# Patient Record
Sex: Male | Born: 1990 | Race: Black or African American | Hispanic: No | Marital: Single | Smoking: Current every day smoker
Health system: Southern US, Community
[De-identification: ages and names within clinical notes are randomized; demographics above are authoritative.]

---
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
## 2008-02-01 ENCOUNTER — Emergency Department (HOSPITAL_COMMUNITY): Admission: EM | Admit: 2008-02-01 | Discharge: 2008-02-01 | Payer: Self-pay | Admitting: Family Medicine

## 2009-01-12 ENCOUNTER — Emergency Department (HOSPITAL_COMMUNITY): Admission: EM | Admit: 2009-01-12 | Discharge: 2009-01-12 | Payer: Self-pay | Admitting: Emergency Medicine

## 2009-02-24 ENCOUNTER — Emergency Department (HOSPITAL_COMMUNITY): Admission: EM | Admit: 2009-02-24 | Discharge: 2009-02-24 | Payer: Self-pay | Admitting: Emergency Medicine

## 2009-04-29 ENCOUNTER — Emergency Department (HOSPITAL_COMMUNITY): Admission: EM | Admit: 2009-04-29 | Discharge: 2009-04-30 | Payer: Self-pay | Admitting: Emergency Medicine

## 2009-05-01 ENCOUNTER — Emergency Department (HOSPITAL_COMMUNITY): Admission: EM | Admit: 2009-05-01 | Discharge: 2009-05-01 | Payer: Self-pay | Admitting: Emergency Medicine

## 2010-04-27 ENCOUNTER — Emergency Department (HOSPITAL_COMMUNITY): Admission: EM | Admit: 2010-04-27 | Discharge: 2010-04-27 | Payer: Self-pay | Admitting: Emergency Medicine

## 2010-05-01 ENCOUNTER — Emergency Department (HOSPITAL_COMMUNITY): Admission: EM | Admit: 2010-05-01 | Discharge: 2010-05-01 | Payer: Self-pay | Admitting: Emergency Medicine

## 2010-07-24 IMAGING — CR DG KNEE 1-2V*R*
2 series · 2 of 2 positions shown · non-contrast
Comparison: None

CLINICAL DATA: Pain and swelling.  No injury.  Question effusion.

RIGHT KNEE - 1-2 VIEW

[t knee ap right]
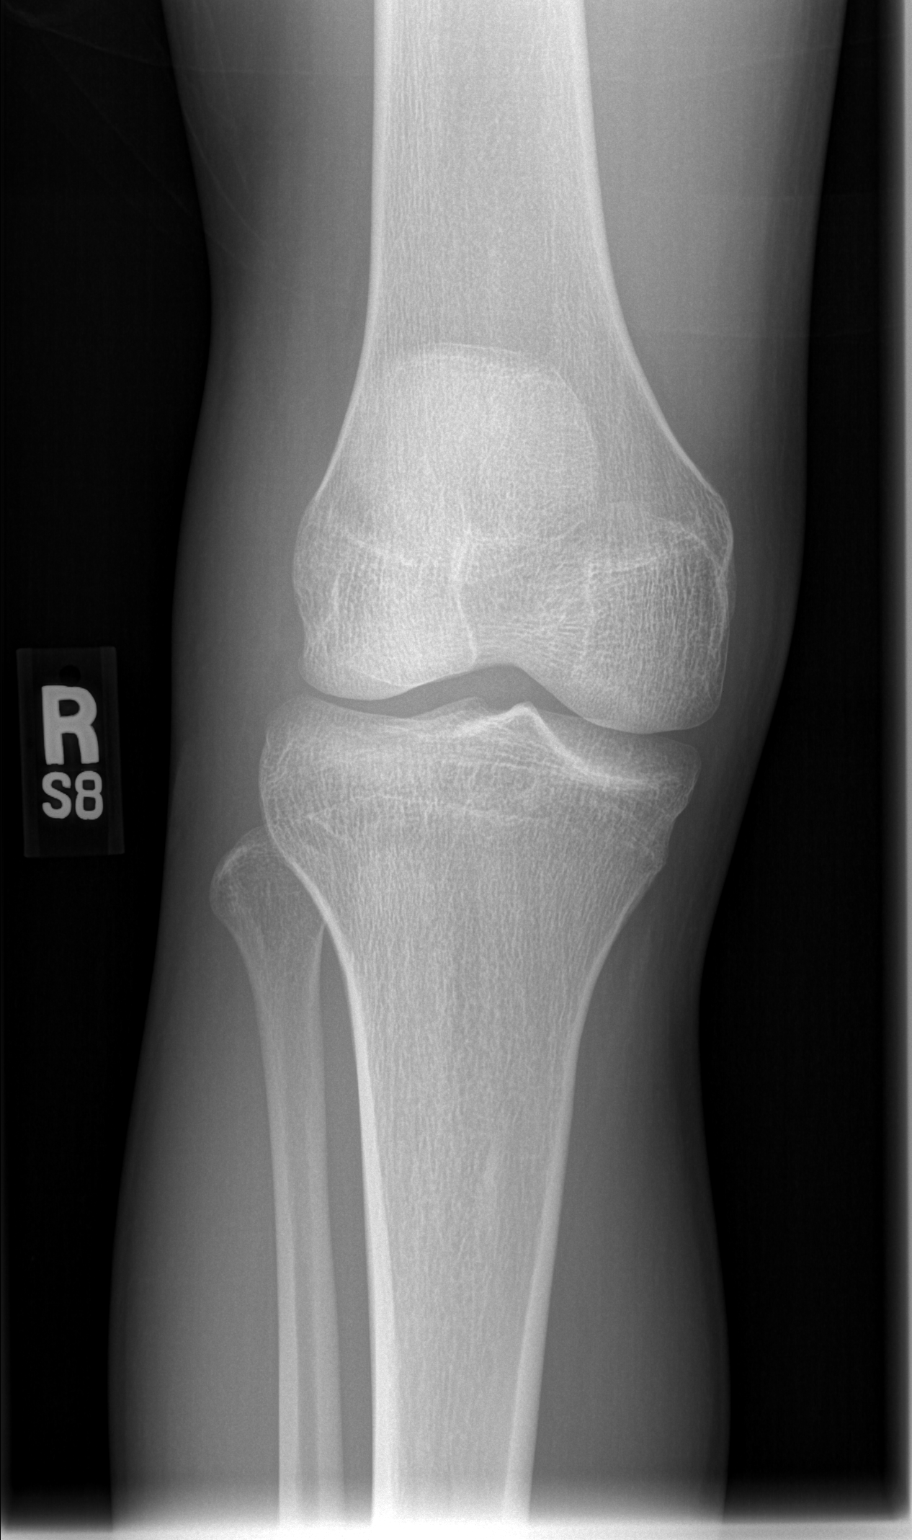

[t knee lat right]
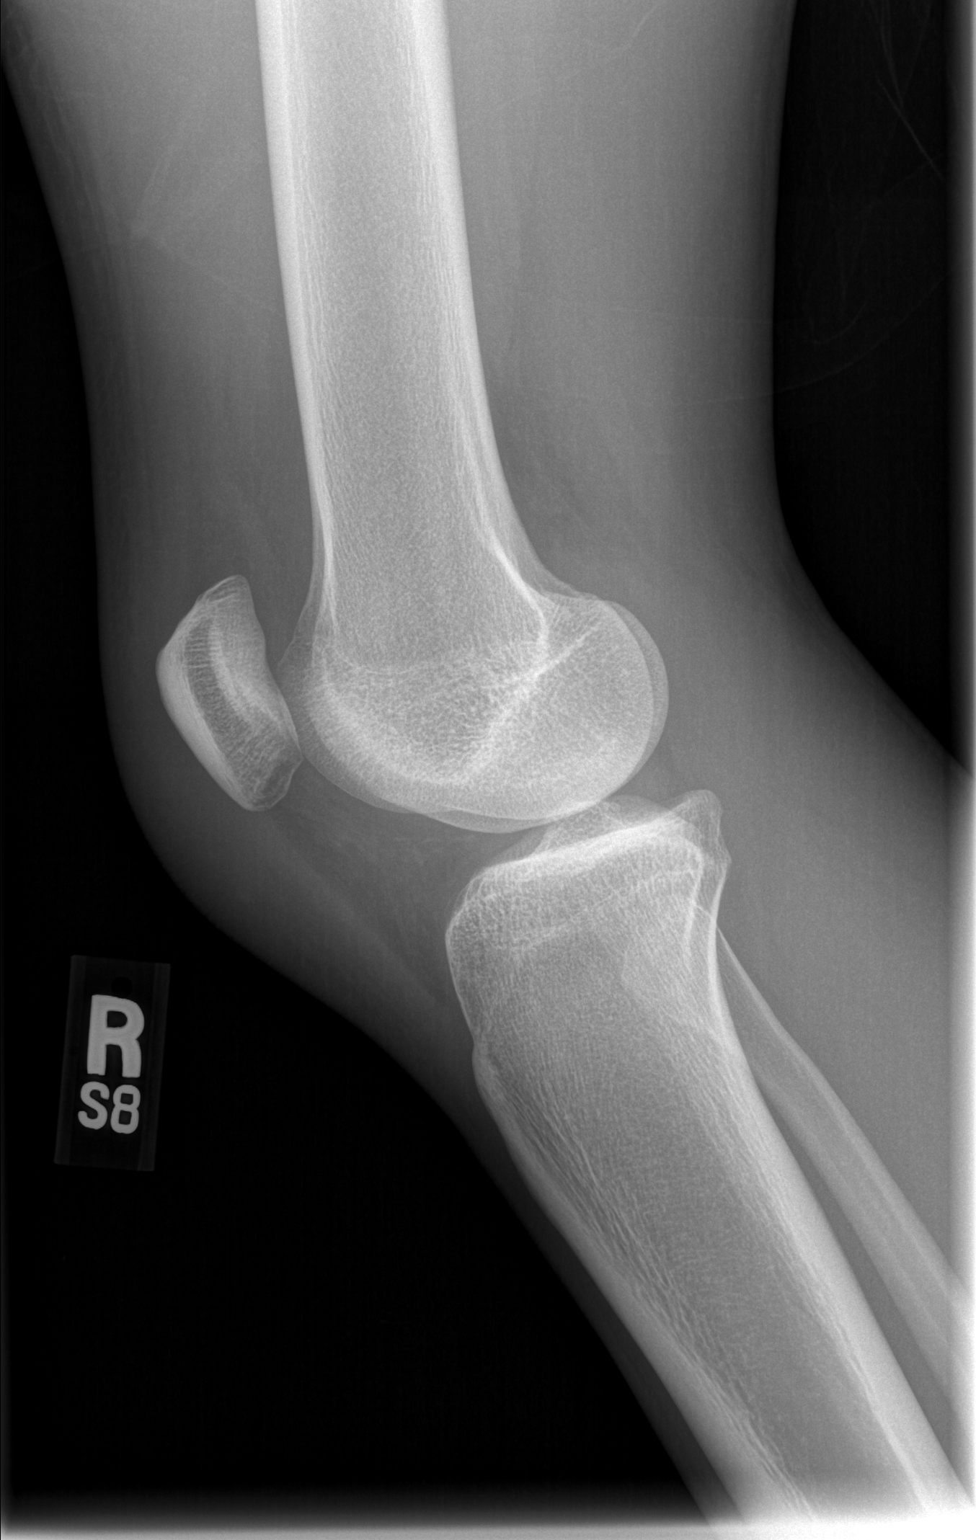

[2 of 2 positions shown; findings below may reference images not displayed]

FINDINGS: No acute fracture or dislocation.  Moderate to marked
prepatellar soft tissue swelling.  Low accuracy for joint effusion
due to extended lateral positioning.  No gross effusion identified.
IMPRESSION: 1.  Prepatellar soft tissue swelling without acute osseous
abnormality.
2.  Decreased accuracy for evaluation of  joint effusion due to
extended positioning.

## 2010-07-26 IMAGING — CR DG KNEE 1-2V*R*
2 series · 2 of 2 positions shown · non-contrast
Comparison: 04/29/2009

CLINICAL DATA: Right knee swelling.  Abscess drain 2 days ago.

RIGHT KNEE - 1-2 VIEW

[t knee ap right]
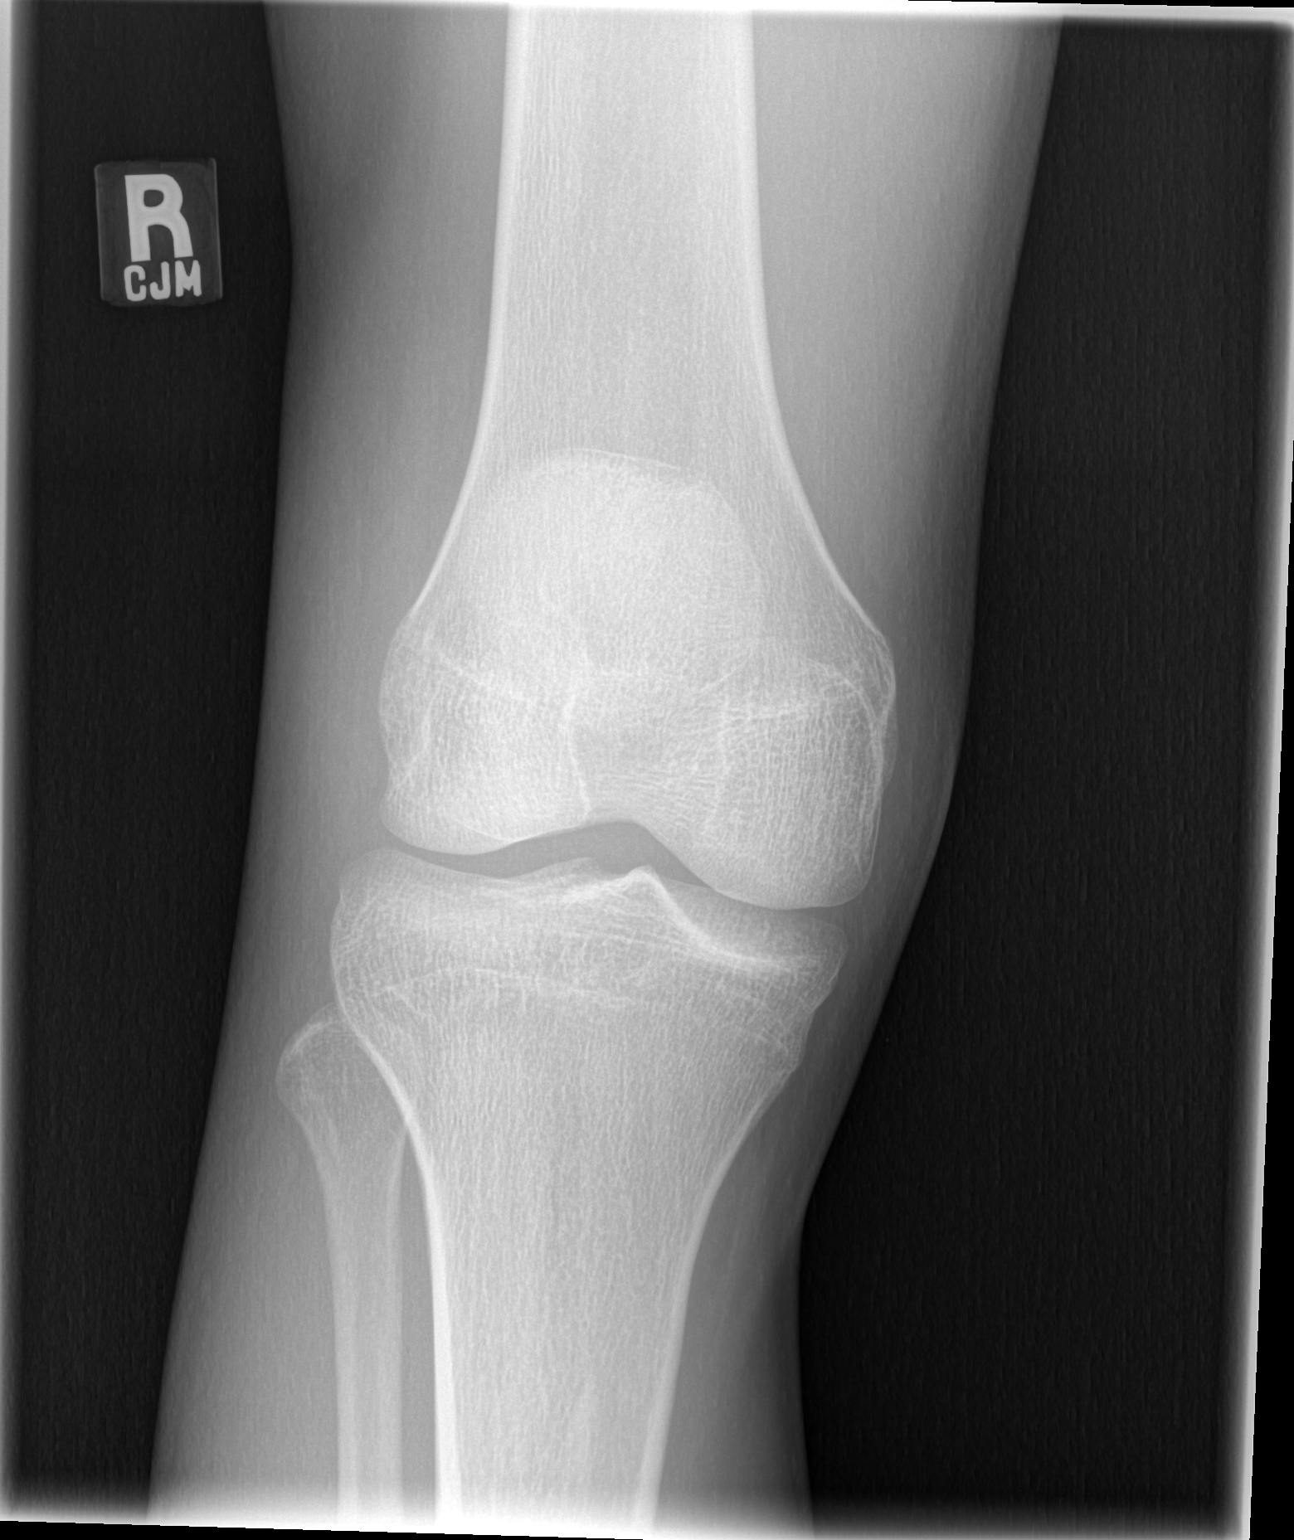

[t knee oblique right]
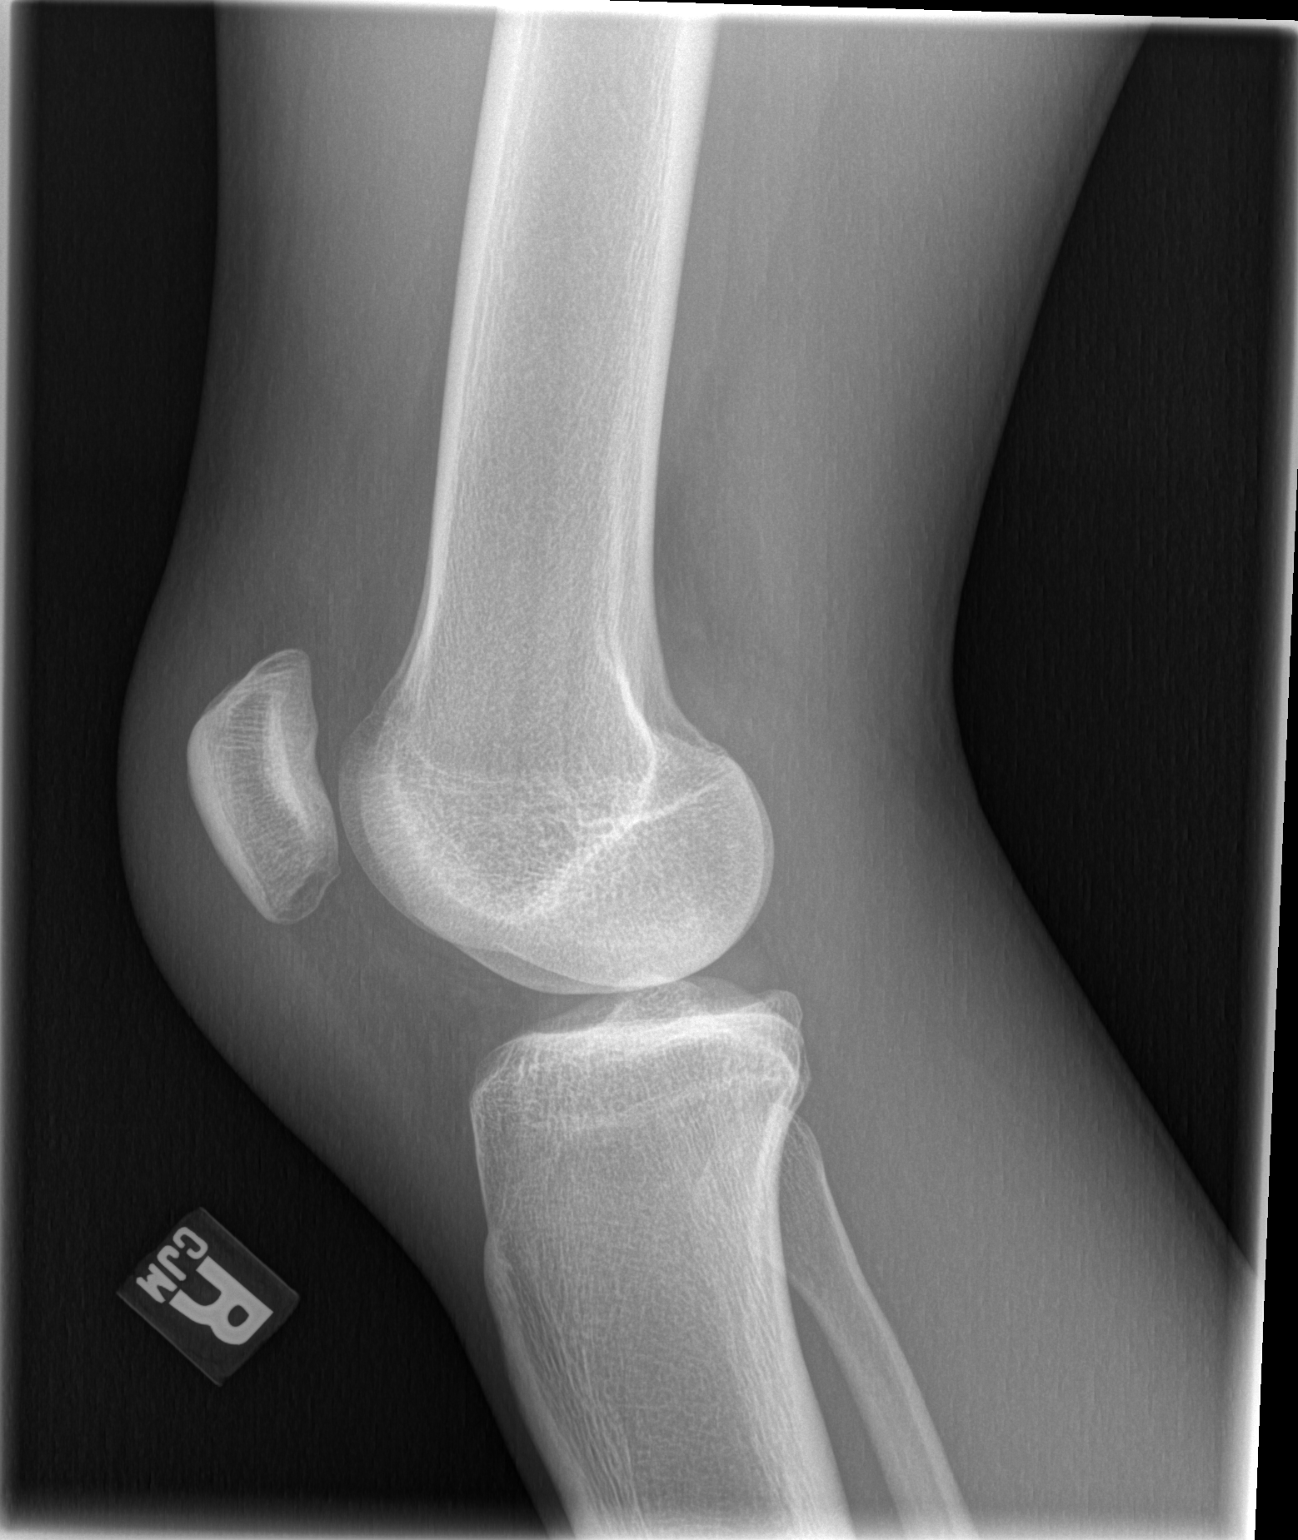

[2 of 2 positions shown; findings below may reference images not displayed]

FINDINGS: There is no evidence of acute bony abnormality, including
fracture, subluxation, or dislocation.
No evidence of osteomyelitis noted.
Prepatellar soft tissue swelling is again identified.
No definite joint effusion is noted.
IMPRESSION: Stable appearance of the right knee with prepatellar soft tissue
swelling.

No evidence of acute bony abnormality or definite joint effusion.

## 2011-01-17 LAB — CBC
HCT: 40.4 % (ref 39.0–52.0)
MCHC: 34.5 g/dL (ref 30.0–36.0)
MCV: 97.2 fL (ref 78.0–100.0)
Platelets: 207 10*3/uL (ref 150–400)
RDW: 11.5 % (ref 11.5–15.5)

## 2011-01-17 LAB — SEDIMENTATION RATE: Sed Rate: 4 mm/hr (ref 0–16)

## 2011-01-17 LAB — DIFFERENTIAL
Basophils Relative: 1 % (ref 0–1)
Eosinophils Absolute: 0.1 10*3/uL (ref 0.0–0.7)
Eosinophils Relative: 1 % (ref 0–5)
Neutrophils Relative %: 73 % (ref 43–77)

## 2011-01-17 LAB — PROTIME-INR: Prothrombin Time: 14.6 seconds (ref 11.6–15.2)

## 2012-04-18 ENCOUNTER — Emergency Department (INDEPENDENT_AMBULATORY_CARE_PROVIDER_SITE_OTHER)
Admission: EM | Admit: 2012-04-18 | Discharge: 2012-04-18 | Disposition: A | Discharge status: Home / Self Care | Payer: Self-pay | Source: Home / Self Care | Attending: Family Medicine | Admitting: Family Medicine

## 2012-04-18 ENCOUNTER — Encounter (HOSPITAL_COMMUNITY): Payer: Self-pay

## 2012-04-18 DIAGNOSIS — L84 Corns and callosities: Secondary | ICD-10-CM

## 2012-04-18 DIAGNOSIS — Z8619 Personal history of other infectious and parasitic diseases: Secondary | ICD-10-CM

## 2012-04-18 MED ORDER — IBUPROFEN 800 MG PO TABS
800.0000 mg | ORAL_TABLET | Freq: Once | ORAL | Status: AC
  Administered 2012-04-18: 800 mg via ORAL

## 2012-04-18 MED ORDER — PERMETHRIN 5 % EX CREA: TOPICAL_CREAM | CUTANEOUS | Status: AC

## 2012-04-18 MED ORDER — IBUPROFEN 800 MG PO TABS
ORAL_TABLET | ORAL | Status: AC
  Filled 2012-04-18: qty 1

## 2012-04-18 NOTE — ED Notes (Signed)
Patient advised we cannot provide w strong pain medication for calloused feet, that his 2 yr duration of problem will need to be addressed by specialist; patient unhappy we are unable to provide this service. Also wants an OTC solution to his scabies instead of RX; advised to consult pharmacist

## 2012-04-18 NOTE — ED Provider Notes (Signed)
History     CSN: [id number]  Arrival date & time 04/18/12  1359   First MD Initiated Contact with Patient 04/18/12 1427      Chief Complaint  Patient presents with  . Foot Pain    (Consider location/radiation/quality/duration/timing/severity/associated sxs/prior treatment) Patient is a 21 y.o. male presenting with lower extremity pain. The history is provided by the patient.  Foot Pain This is a chronic problem. The current episode started more than 1 week ago (for over 2 yrs has had foot problems.with callouses.). The problem has been gradually worsening. Associated symptoms comments: Also concerned that scabies may be recurrent..    No past medical history on file.  No past surgical history on file.  No family history on file.  History  Substance Use Topics  . Smoking status: Not on file  . Smokeless tobacco: Not on file  . Alcohol Use: Not on file      Review of Systems  Constitutional: Negative.   Musculoskeletal: Positive for gait problem.  Skin: Positive for rash.    Allergies  Review of patient's allergies indicates not on file.  Home Medications   Current Outpatient Rx  Name Route Sig Dispense Refill  . PERMETHRIN 5 % EX CREA  Use as directed 60 g 0    BP 138/60  Pulse 72  Temp 98.8 F (37.1 C) (Oral)  Resp 18  SpO2 97%  Physical Exam  Nursing note and vitals reviewed. Constitutional: He appears well-developed and well-nourished.  Musculoskeletal: He exhibits tenderness.       Feet:  Skin: No rash noted.    ED Course  Procedures (including critical care time)  Labs Reviewed - No data to display No results found.   1. Foot callus   2. History of scabies       MDM          Adam Hoff, MD 04/18/12 [phone]

## 2012-04-18 NOTE — ED Notes (Signed)
C/o pain in his feet for 2 years , and return of scabies

## 2013-03-17 ENCOUNTER — Emergency Department (HOSPITAL_COMMUNITY)
Admission: EM | Admit: 2013-03-17 | Discharge: 2013-03-17 | Disposition: A | Discharge status: Home / Self Care | Payer: Self-pay | Attending: Emergency Medicine | Admitting: Emergency Medicine

## 2013-03-17 ENCOUNTER — Encounter (HOSPITAL_COMMUNITY): Payer: Self-pay | Admitting: Emergency Medicine

## 2013-03-17 DIAGNOSIS — Y9239 Other specified sports and athletic area as the place of occurrence of the external cause: Secondary | ICD-10-CM | POA: Insufficient documentation

## 2013-03-17 DIAGNOSIS — Z87828 Personal history of other (healed) physical injury and trauma: Secondary | ICD-10-CM | POA: Insufficient documentation

## 2013-03-17 DIAGNOSIS — Y9367 Activity, basketball: Secondary | ICD-10-CM | POA: Insufficient documentation

## 2013-03-17 DIAGNOSIS — S99929A Unspecified injury of unspecified foot, initial encounter: Secondary | ICD-10-CM | POA: Insufficient documentation

## 2013-03-17 DIAGNOSIS — X58XXXA Exposure to other specified factors, initial encounter: Secondary | ICD-10-CM | POA: Insufficient documentation

## 2013-03-17 DIAGNOSIS — M25561 Pain in right knee: Secondary | ICD-10-CM

## 2013-03-17 DIAGNOSIS — S8990XA Unspecified injury of unspecified lower leg, initial encounter: Secondary | ICD-10-CM | POA: Insufficient documentation

## 2013-03-17 MED ORDER — OXYCODONE-ACETAMINOPHEN 5-325 MG PO TABS: 1.0000 | ORAL_TABLET | ORAL | Status: DC | PRN

## 2013-03-17 MED ORDER — OXYCODONE-ACETAMINOPHEN 5-325 MG PO TABS
1.0000 | ORAL_TABLET | Freq: Once | ORAL | Status: AC
  Administered 2013-03-17: 1 via ORAL
  Filled 2013-03-17: qty 1

## 2013-03-17 NOTE — ED Notes (Addendum)
Pt states he has had R knee swelling for past 2 weeks.  Pt states he injured knee 6 months ago, but knee got better.  States he was playing basketball when swelling began again.  Denies any new injury.  Pt able to walk on leg

## 2013-03-17 NOTE — ED Provider Notes (Signed)
History     CSN: [id number]  Arrival date & time 03/17/13  4098   First MD Initiated Contact with Patient 03/17/13 [phone]      Chief Complaint  Patient presents with  . Joint Swelling  . Knee Pain    (Consider location/radiation/quality/duration/timing/severity/associated sxs/prior treatment) HPI Comments: Patient presents to the ED for right knee pain. Initial knee injury approx 6 months ago but was never formally evaluated.  Reports it initially started feeling better, however he sustained a new injury 2 weeks ago while playing basketball. Knee has been intermittently swollen and TTP. Pain is exacerbated with weightbearing but pt is able to walk normally.  Patient has crutches at home that he has been using intermittently. He has also been icing and elevating the knee and taking OTC pain meds with minimal relief of sx.  Denies any numbness or paresthesias of right leg or foot.  The history is provided by the patient.    History reviewed. No pertinent past medical history.  History reviewed. No pertinent past surgical history.  History reviewed. No pertinent family history.  History  Substance Use Topics  . Smoking status: Not on file  . Smokeless tobacco: Not on file  . Alcohol Use: Not on file      Review of Systems  Musculoskeletal: Positive for joint swelling and arthralgias.  All other systems reviewed and are negative.    Allergies  Review of patient's allergies indicates no known allergies.  Home Medications  No current outpatient prescriptions on file.  There were no vitals taken for this visit.  Physical Exam  Nursing note and vitals reviewed. Constitutional: He is oriented to person, place, and time. He appears well-developed and well-nourished.  HENT:  Head: Normocephalic and atraumatic.  Mouth/Throat: Oropharynx is clear and moist.  Eyes: Conjunctivae and EOM are normal. Pupils are equal, round, and reactive to light.  Neck: Normal range of motion.   Cardiovascular: Normal rate, regular rhythm and normal heart sounds.   Pulmonary/Chest: Effort normal and breath sounds normal.  Musculoskeletal:       Right knee: He exhibits decreased range of motion, swelling and effusion. He exhibits no deformity, no laceration, no erythema and normal alignment. Tenderness found. Medial joint line tenderness noted.  Limited internal and external rotation of knee due to pain, + McMurray's  Neurological: He is alert and oriented to person, place, and time.  Skin: Skin is warm and dry.  Psychiatric: He has a normal mood and affect.    ED Course  Procedures (including critical care time)  Labs Reviewed - No data to display No results found.   1. Right knee pain       MDM   Doubt acute fx or dislocation, imagining deferred.  Knee with minimal medial effusion.  Pain with internal and external rotation of knee, + McMurray's test- concern for meniscal injury.  Knee sleeve placed in the ED.  Pt has crutches at home that he will use.  FU with orthopedics- Dr. Ranell Patrick.  Rx percocet.  Discussed plan with pt, he agreed.  Return precautions advised.      Garlon Hatchet, PA-C 03/17/13 1542

## 2013-03-18 NOTE — ED Provider Notes (Signed)
Medical screening examination/treatment/procedure(s) were performed by non-physician practitioner and as supervising physician I was immediately available for consultation/collaboration.   Rolan Bucco, MD 03/18/13 [phone]

## 2013-06-14 ENCOUNTER — Encounter (HOSPITAL_COMMUNITY): Payer: Self-pay

## 2013-06-14 ENCOUNTER — Emergency Department (HOSPITAL_COMMUNITY)
Admission: EM | Admit: 2013-06-14 | Discharge: 2013-06-14 | Disposition: A | Discharge status: Home / Self Care | Payer: Self-pay | Attending: Emergency Medicine | Admitting: Emergency Medicine

## 2013-06-14 DIAGNOSIS — R238 Other skin changes: Secondary | ICD-10-CM

## 2013-06-14 DIAGNOSIS — F17200 Nicotine dependence, unspecified, uncomplicated: Secondary | ICD-10-CM | POA: Insufficient documentation

## 2013-06-14 DIAGNOSIS — L988 Other specified disorders of the skin and subcutaneous tissue: Secondary | ICD-10-CM | POA: Insufficient documentation

## 2013-06-14 NOTE — Progress Notes (Signed)
[facility] provided pt with a list of primary care resources in [place].  °

## 2013-06-14 NOTE — ED Provider Notes (Signed)
  Medical screening examination/treatment/procedure(s) were performed by non-physician practitioner and as supervising physician I was immediately available for consultation/collaboration.    Gerhard Munch, MD 06/14/13 1606

## 2013-06-14 NOTE — ED Notes (Signed)
Pt c/o "bump" on tip of penis this morning.  Denies pain.  Denies discharge. Denies new sexual partners.  Denies Hx of STDs.

## 2013-06-14 NOTE — ED Provider Notes (Signed)
CSN: [id number]     Arrival date & time 06/14/13  1034 History   First MD Initiated Contact with Patient 06/14/13 1047     Chief Complaint  Patient presents with  . Genital rash    (Consider location/radiation/quality/duration/timing/severity/associated sxs/prior Treatment) HPI Comments: Patient is a 22 year old male who presents today with painless and a bump on his penis since this morning. He reports that he woke up and noticed it. He has had no sexual intercourse in the past 3 weeks. No new sexual partner. He has not done anything to make the pump better or worse. He denies any past medical history. He denies any prior STDs in the past. He denies fever, chills, nausea, vomiting, dysuria, urinary urgency, urinary frequency, penile discharge, penile pain, testicular pain, testicular swelling, dyspareunia.  The history is provided by the patient. No language interpreter was used.    History reviewed. No pertinent past medical history. History reviewed. No pertinent past surgical history. History reviewed. No pertinent family history. History  Substance Use Topics  . Smoking status: Current Every Day Smoker -- 1.00 packs/day    Types: Cigarettes  . Smokeless tobacco: Never Used  . Alcohol Use: Yes    Review of Systems  Constitutional: Negative for fever and chills.  Respiratory: Negative for shortness of breath.   Gastrointestinal: Negative for nausea, vomiting and abdominal pain.  Genitourinary: Positive for genital sores. Negative for dysuria, urgency, frequency, hematuria, decreased urine volume, discharge, penile swelling, scrotal swelling, difficulty urinating, penile pain and testicular pain.  All other systems reviewed and are negative.    Allergies  Review of patient's allergies indicates no known allergies.  Home Medications  No current outpatient prescriptions on file. BP 130/66  Pulse 80  Temp(Src) 98.3 F (36.8 C) (Oral)  Resp 16  SpO2 100% Physical Exam   Nursing note and vitals reviewed. Constitutional: He is oriented to person, place, and time. He appears well-developed and well-nourished. No distress.  HENT:  Head: Normocephalic and atraumatic.  Right Ear: External ear normal.  Left Ear: External ear normal.  Nose: Nose normal.  Eyes: Conjunctivae are normal.  Neck: Normal range of motion. No tracheal deviation present.  Cardiovascular: Normal rate, regular rhythm and normal heart sounds.   Pulmonary/Chest: Effort normal and breath sounds normal. No stridor.  Abdominal: Soft. He exhibits no distension. There is no tenderness. Hernia confirmed negative in the right inguinal area and confirmed negative in the left inguinal area.  Genitourinary: Testes normal. Right testis shows no mass, no swelling and no tenderness. Right testis is descended. Cremasteric reflex is not absent on the right side. Left testis shows no mass, no swelling and no tenderness. Left testis is descended. Cremasteric reflex is not absent on the left side. Circumcised. No phimosis, paraphimosis, hypospadias or penile tenderness. No discharge found.  Single 5 mm papule on head of penis  Musculoskeletal: Normal range of motion.  Lymphadenopathy:       Right: No inguinal adenopathy present.       Left: No inguinal adenopathy present.  Neurological: He is alert and oriented to person, place, and time.  Skin: Skin is warm and dry. He is not diaphoretic.  Psychiatric: He has a normal mood and affect. His behavior is normal.    ED Course  Procedures (including critical care time) Labs Review Labs Reviewed  GC/CHLAMYDIA PROBE AMP   Imaging Review No results found.  MDM   1. Papule    Patient presents with painless papule on the head  of the penis. It is not painful. There is no pustule. The rash is not vesicular. There is no ulceration. GC chlamydia swab performed. Discussed with patient that he would get a phone call if his results were positive. No new sexual  partners. No known STD exposure. No treatment indicated at this time. Return instructions given. And stable for discharge. Patient / Family / Caregiver informed of clinical course, understand medical decision-making process, and agree with plan.      Mora Bellman, PA-C 06/14/13 1149

## 2014-02-03 ENCOUNTER — Encounter (HOSPITAL_COMMUNITY): Payer: Self-pay | Admitting: Emergency Medicine

## 2014-02-03 ENCOUNTER — Emergency Department (HOSPITAL_COMMUNITY)
Admission: EM | Admit: 2014-02-03 | Discharge: 2014-02-03 | Disposition: A | Discharge status: Home / Self Care | Payer: Self-pay | Attending: Emergency Medicine | Admitting: Emergency Medicine

## 2014-02-03 DIAGNOSIS — K089 Disorder of teeth and supporting structures, unspecified: Secondary | ICD-10-CM | POA: Insufficient documentation

## 2014-02-03 DIAGNOSIS — K029 Dental caries, unspecified: Secondary | ICD-10-CM | POA: Insufficient documentation

## 2014-02-03 DIAGNOSIS — K002 Abnormalities of size and form of teeth: Secondary | ICD-10-CM | POA: Insufficient documentation

## 2014-02-03 DIAGNOSIS — F17200 Nicotine dependence, unspecified, uncomplicated: Secondary | ICD-10-CM | POA: Insufficient documentation

## 2014-02-03 DIAGNOSIS — K0889 Other specified disorders of teeth and supporting structures: Secondary | ICD-10-CM

## 2014-02-03 MED ORDER — PENICILLIN V POTASSIUM 500 MG PO TABS: 500.0000 mg | ORAL_TABLET | Freq: Four times a day (QID) | ORAL | Status: DC

## 2014-02-03 MED ORDER — DICLOFENAC SODIUM 75 MG PO TBEC: 75.0000 mg | DELAYED_RELEASE_TABLET | Freq: Two times a day (BID) | ORAL | Status: DC

## 2014-02-03 MED ORDER — LIDOCAINE VISCOUS 2 % MT SOLN
15.0000 mL | Freq: Once | OROMUCOSAL | Status: DC
  Filled 2014-02-03: qty 15

## 2014-02-03 MED ORDER — LIDOCAINE VISCOUS 2 % MT SOLN: 20.0000 mL | OROMUCOSAL | Status: DC | PRN

## 2014-02-03 NOTE — ED Notes (Signed)
Patient is alert and oriented x3.  He was given DC instructions and follow up visit instructions.  Patient gave verbal understanding.  He was DC ambulatory under his own power to home.  V/S stable.  He was not showing any signs of distress on DC.  Patient left before medication could be administered

## 2014-02-03 NOTE — ED Provider Notes (Signed)
Medical screening examination/treatment/procedure(s) were performed by non-physician practitioner and as supervising physician I was immediately available for consultation/collaboration.   EKG Interpretation None       [name] M Yosmar Ryker, MD 02/03/14 2141 

## 2014-02-03 NOTE — ED Provider Notes (Signed)
CSN: [id number]     Arrival date & time 02/03/14  0547 History   First MD Initiated Contact with Patient 02/03/14 0601     Chief Complaint  Patient presents with  . Dental Pain     (Consider location/radiation/quality/duration/timing/severity/associated sxs/prior Treatment) HPI Comments: Patient is a 23 year old male past medical history significant for tobacco use presented to the emergency department for left lower throbbing dental pain x1 week. States pain has become constant he has tried Motrin, Tylenol, salt water gargles without relief. Eating and drinking greatest pain. He is due to see his dentist next week. He denies any fevers, chills, nausea, vomiting, purulent drainage from tooth or facial swelling.  Patient is a 23 y.o. male presenting with tooth pain.  Dental Pain Associated symptoms: no fever     History reviewed. No pertinent past medical history. History reviewed. No pertinent past surgical history. History reviewed. No pertinent family history. History  Substance Use Topics  . Smoking status: Current Every Day Smoker -- 0.50 packs/day    Types: Cigarettes  . Smokeless tobacco: Never Used  . Alcohol Use: Yes     Comment: socially    Review of Systems  Constitutional: Negative for fever and chills.  HENT: Positive for dental problem.   All other systems reviewed and are negative.     Allergies  Review of patient's allergies indicates no known allergies.  Home Medications   Prior to Admission medications   Not on File   BP 119/72  Pulse 70  Temp(Src) 97.5 F (36.4 C) (Oral)  Resp 16  Ht 6' (1.829 m)  Wt 125 lb (56.7 kg)  BMI 16.95 kg/m2  SpO2 100% Physical Exam  Nursing note and vitals reviewed. Constitutional: He is oriented to person, place, and time. He appears well-developed and well-nourished. No distress.  HENT:  Head: Normocephalic and atraumatic.  Right Ear: Hearing, tympanic membrane, external ear and ear canal normal.  Left Ear:  Hearing, tympanic membrane, external ear and ear canal normal.  Nose: Nose normal.  Mouth/Throat: Uvula is midline, oropharynx is clear and moist and mucous membranes are normal. No trismus in the jaw. Abnormal dentition. Dental caries present. No dental abscesses or uvula swelling. No oropharyngeal exudate or tonsillar abscesses.    Eyes: Conjunctivae are normal.  Neck: Normal range of motion. Neck supple.  Cardiovascular: Normal rate, regular rhythm and normal heart sounds.   Pulmonary/Chest: Effort normal and breath sounds normal. No respiratory distress.  Neurological: He is alert and oriented to person, place, and time.  Skin: Skin is warm and dry. He is not diaphoretic.  Psychiatric: He has a normal mood and affect.    ED Course  Procedures (including critical care time) Medications  lidocaine (XYLOCAINE) 2 % viscous mouth solution 15 mL (not administered)    Labs Review Labs Reviewed - No data to display  Imaging Review No results found.   EKG Interpretation None      MDM   Final diagnoses:  Pain, dental    Filed Vitals:   02/03/14 0553  BP: 119/72  Pulse: 70  Temp: 97.5 F (36.4 C)  Resp: 16   Afebrile, NAD, non-toxic appearing, AAOx4.  Patient with toothache.  No gross abscess.  Exam unconcerning for Ludwig's angina or spread of infection.  Will treat with penicillin and pain medicine.  Urged patient to follow-up with dentist. Return precautions discussed. Patient is agreeable to plan. Patient is stable at time of discharge.        Victorino DikeJennifer  L Alla Sloma, PA-C 02/03/14 [phone]

## 2014-02-03 NOTE — ED Notes (Signed)
Patient c/o left sided dental pain x1 week. Denies any prior evaluation. No meds taken at home. Patient states he got an antibiotic from his father but is unsure the name.

## 2014-02-03 NOTE — Discharge Instructions (Signed)
Please follow up with your primary care physician in 1-2 days. If you do not have one please call the [facility] number listed above. Please follow up with your dentist next week as scheduled for your follow up appointment. Please take your antibiotic until completion. Please use other medications as prescribed. Please read all discharge instructions and return precautions.    Dental Pain A tooth ache may be caused by cavities (tooth decay). Cavities expose the nerve of the tooth to air and hot or cold temperatures. It may come from an infection or abscess (also called a boil or furuncle) around your tooth. It is also often caused by dental caries (tooth decay). This causes the pain you are having. DIAGNOSIS  Your caregiver can diagnose this problem by exam. TREATMENT   If caused by an infection, it may be treated with medications which kill germs (antibiotics) and pain medications as prescribed by your caregiver. Take medications as directed.  Only take over-the-counter or prescription medicines for pain, discomfort, or fever as directed by your caregiver.  Whether the tooth ache today is caused by infection or dental disease, you should see your dentist as soon as possible for further care. SEEK MEDICAL CARE IF: The exam and treatment you received today has been provided on an emergency basis only. This is not a substitute for complete medical or dental care. If your problem worsens or new problems (symptoms) appear, and you are unable to meet with your dentist, call or return to this location. SEEK IMMEDIATE MEDICAL CARE IF:   You have a fever.  You develop redness and swelling of your face, jaw, or neck.  You are unable to open your mouth.  You have severe pain uncontrolled by pain medicine. MAKE SURE YOU:   Understand these instructions.  Will watch your condition.  Will get help right away if you are not doing well or get worse. Document Released: 09/27/2005  Document Revised: 12/20/2011 Document Reviewed: 05/15/2008 [facility] Patient Information 2014 [employer] [place]  RESOURCE GUIDE  If you do not have a primary care doctor to follow up with regarding today's visit, please call the [facility] at [phone] to make an appointment. Hours of operation are 10am - 7pm, Monday through Friday, and they have a sliding scale fee.    Dental Assistance Please contact the on-call dentist listed on your discharge papers WITHIN 48 HOURS.  If the on-call dentist agrees that your condition is emergent, there is a CHANCE (not a guarantee) that you MAY receive a savings on your visit at this point. If you wait more than 48 hours, it will not be considered an emergency.   Drs. Moreen Fowleravid and Janna Civils:  [address], [place], [place], [id number], [phone]  Short-notice availability  Tooth evaluation: $100  Emergency Treatment: $200 (including exam, xrays, tooth extraction, and post-op visit)  Patients with Medicaid: [facility] [phone], [phone] [address], [phone]  If unable to pay, or uninsured, contact [facility] [phone] in [place], [phone] in [place]) to become qualified for the adult dental clinic  Other Low-Cost Community Dental Services: - [facility]: [address], [place], [place], [id number], [phone], [address]. [phone], 2nd and 4th Thursday of the month at 6:30am.  10 clients each day by appointment, can sometimes see walk-in patients if someone does not show for an appointment. [facility]:  [address], [place], [place],  [id number], [phone] [address]:  [address], [place], [place], [id number], [phone] [facility]:  [phone] [facility]:  [phone] [facility]:  [phone]

## 2014-10-08 ENCOUNTER — Encounter (HOSPITAL_COMMUNITY): Payer: Self-pay | Admitting: Emergency Medicine

## 2014-10-08 ENCOUNTER — Emergency Department (HOSPITAL_COMMUNITY)
Admission: EM | Admit: 2014-10-08 | Discharge: 2014-10-08 | Disposition: A | Discharge status: Home / Self Care | Payer: Self-pay | Attending: Emergency Medicine | Admitting: Emergency Medicine

## 2014-10-08 DIAGNOSIS — Z791 Long term (current) use of non-steroidal anti-inflammatories (NSAID): Secondary | ICD-10-CM | POA: Insufficient documentation

## 2014-10-08 DIAGNOSIS — K0889 Other specified disorders of teeth and supporting structures: Secondary | ICD-10-CM

## 2014-10-08 DIAGNOSIS — Z720 Tobacco use: Secondary | ICD-10-CM | POA: Insufficient documentation

## 2014-10-08 DIAGNOSIS — K088 Other specified disorders of teeth and supporting structures: Secondary | ICD-10-CM | POA: Insufficient documentation

## 2014-10-08 DIAGNOSIS — Z792 Long term (current) use of antibiotics: Secondary | ICD-10-CM | POA: Insufficient documentation

## 2014-10-08 MED ORDER — TRAMADOL HCL 50 MG PO TABS: 50.0000 mg | ORAL_TABLET | Freq: Four times a day (QID) | ORAL | Status: DC | PRN

## 2014-10-08 MED ORDER — PENICILLIN V POTASSIUM 500 MG PO TABS: 500.0000 mg | ORAL_TABLET | Freq: Four times a day (QID) | ORAL | Status: AC

## 2014-10-08 NOTE — Discharge Instructions (Signed)
You have a dental injury. Use the resource guide listed below to help you find a dentist if you do not already have one to followup with. It is very important that you get evaluated by a dentist as soon as possible. Call tomorrow to schedule an appointment. Use your pain medication as prescribed and do not operate heavy machinery while on pain medication. Take your full course of antibiotics. Read the instructions below. ° °Eat a soft or liquid diet and rinse your mouth out after meals with warm water. You should see a dentist or return here at once if you have increased swelling, increased pain or uncontrolled bleeding from the site of your injury. ° ° °SEEK MEDICAL CARE IF:  °· You have increased pain not controlled with medicines.  °· You have swelling around your tooth, in your face or neck.  °· You have bleeding which starts, continues, or gets worse.  °· You have a fever >101 °· If you are unable to open your mouth ° °RESOURCE GUIDE ° °Dental Problems ° °Patients with Medicaid: °[facility]                     [facility] °[address]                                           [address] °Phone:  [phone]                                                  Phone:  510-2600 ° °If unable to pay or uninsured, contact:  [facility] or [facility] to become qualified for the adult dental clinic. ° °Chronic Pain Problems °Contact [facility]  297-2271 °Patients need to be referred by their primary care doctor. ° °Insufficient Money for Medicine °Contact [employer]:  call "211" or [facility] 271-5999. ° °No Primary Care Doctor °Call [facility]  832-8000 °Other agencies that provide inexpensive medical care °   [facility]  832-8035 °   [facility]  832-7272 °   [facility]  271-5999 °   [facility]  832-4777 °   [facility]  373-0678 °   [facility]   272-1050 ° °Psychological Services °[facility]  832-9600 °[facility]  378-7881 °[facility]   800 853-5163 (emergency services 641-4993) ° °Substance Abuse Resources °[facility]  336-882-2125 °[facility] 336-784-9470 °[facility] 336-285-9073 °[facility] 336-845-3988 °Residential & Outpatient Substance Abuse Program  800-659-3381 ° °Abuse/Neglect °[place] [facility] (336) 641-3795 °[place] [facility] 800-378-5315 (After Hours) ° °Emergency Shelter °[facility] (336) 271-5985 ° °Maternity Homes °[facility] (336) 275-9566 °[facility] (704) 372-4663 ° °MRSA Hotline #:   832-7006 ° ° ° °[place] [facility] ° °[facility] [facility]     [employer]                          [facility] °[address] [place]                         [address]      [address]  °[place]                                                [place]                            [place] °Phone:  349-3220                                   Phone:  [phone]                 Phone:  342-8140 ° °[facility] °Phone:  342-8316 ° °[place] [facility] °(336) 342-1394 °(336) 342-3537 (After Hours) ° ° ° ° °

## 2014-10-08 NOTE — ED Provider Notes (Signed)
CSN: [id number]     Arrival date & time 10/08/14  1710 History   This chart was scribed for non-physician practitioner, Santiago GladHeather Gurley Climer, PA-C working with Elwin MochaBlair Walden, MD, by Abel PrestoKara Demonbreun, ED Scribe. This patient was seen in room WTR8/WTR8 and the patient's care was started at 6:41 PM.    Chief Complaint  Patient presents with  . Dental Pain     The history is provided by the patient. No language interpreter was used.    HPI Comments: Adam Medina is a 23 y.o. male who presents to the Emergency Department complaining of worsening left lower dental pain with onset a week ago. Pain gradually worsening.  Pt notes he broke his tooth about a year ago. Pt notes he has been taking Tylenol for relief. Pt denies fever, chills, nausea, vomiting, and difficulty swallowing.  He does not have a dentist.    History reviewed. No pertinent past medical history. History reviewed. No pertinent past surgical history. No family history on file. History  Substance Use Topics  . Smoking status: Current Every Day Smoker -- 0.50 packs/day    Types: Cigarettes  . Smokeless tobacco: Never Used  . Alcohol Use: Yes     Comment: socially    Review of Systems  Constitutional: Negative for fever and chills.  HENT: Positive for dental problem. Negative for trouble swallowing.   Gastrointestinal: Negative for nausea and vomiting.  All other systems reviewed and are negative.     Allergies  Review of patient's allergies indicates no known allergies.  Home Medications   Prior to Admission medications   Medication Sig Start Date End Date Taking? Authorizing Provider  acetaminophen (TYLENOL) 325 MG tablet Take 650 mg by mouth every 6 (six) hours as needed for mild pain.    Historical Provider, MD  diclofenac (VOLTAREN) 75 MG EC tablet Take 1 tablet (75 mg total) by mouth 2 (two) times daily. 02/03/14   Jennifer L Piepenbrink, PA-C  ibuprofen (ADVIL,MOTRIN) 200 MG tablet Take 400 mg by mouth every 6  (six) hours as needed for moderate pain.    Historical Provider, MD  lidocaine (XYLOCAINE) 2 % solution Use as directed 20 mLs in the mouth or throat as needed for mouth pain. 02/03/14   Jennifer L Piepenbrink, PA-C  penicillin v potassium (VEETID) 500 MG tablet Take 1 tablet (500 mg total) by mouth 4 (four) times daily. 02/03/14   Jennifer L Piepenbrink, PA-C   BP 154/72 mmHg  Pulse 70  Temp(Src) 98.7 F (37.1 C) (Oral)  Resp 20  SpO2 98% Physical Exam  Constitutional: He is oriented to person, place, and time. He appears well-developed and well-nourished.  HENT:  Head: Normocephalic.  Mouth/Throat: No trismus in the jaw. No dental abscesses.  TTP left lower gingiva, no obvious abscess. No sublingual tenderness or swelling  Eyes: Conjunctivae are normal.  Neck: Normal range of motion. Neck supple.  Cardiovascular: Normal rate, regular rhythm and normal heart sounds.  Exam reveals no friction rub.   No murmur heard. Pulmonary/Chest: Effort normal and breath sounds normal. No respiratory distress. He has no wheezes. He has no rales.  Musculoskeletal: Normal range of motion.  Lymphadenopathy:       Head (left side): No submental and no submandibular adenopathy present.  Neurological: He is alert and oriented to person, place, and time.  Skin: Skin is warm and dry.  Psychiatric: He has a normal mood and affect. His behavior is normal.  Nursing note and vitals reviewed.  ED Course  Procedures (including critical care time) DIAGNOSTIC STUDIES: Oxygen Saturation is 98% on room air, normal by my interpretation.    COORDINATION OF CARE: 6:46 PM Discussed treatment plan with patient at beside, the patient agrees with the plan and has no further questions at this time.   Labs Review Labs Reviewed - No data to display  Imaging Review No results found.   EKG Interpretation None      MDM   Final diagnoses:  None   Patient with toothache.  No gross abscess.  Exam unconcerning  for Ludwig's angina or spread of infection.  Will treat with penicillin and pain medicine.  Urged patient to follow-up with dentist.  Return precautions given.  I personally performed the services described in this documentation, which was scribed in my presence. The recorded information has been reviewed and is accurate.      Santiago GladHeather Shoichi Mielke, PA-C 10/08/14 1858  Elwin MochaBlair Walden, MD 10/08/14 [phone]

## 2014-10-08 NOTE — ED Notes (Signed)
Pt c/o left lower dental pain, has broken tooth to area.

## 2015-05-22 ENCOUNTER — Emergency Department (HOSPITAL_COMMUNITY)
Admission: EM | Admit: 2015-05-22 | Discharge: 2015-05-22 | Disposition: A | Discharge status: Home / Self Care | Payer: Self-pay | Attending: Emergency Medicine | Admitting: Emergency Medicine

## 2015-05-22 ENCOUNTER — Encounter (HOSPITAL_COMMUNITY): Payer: Self-pay | Admitting: Emergency Medicine

## 2015-05-22 DIAGNOSIS — Y998 Other external cause status: Secondary | ICD-10-CM | POA: Insufficient documentation

## 2015-05-22 DIAGNOSIS — Z79899 Other long term (current) drug therapy: Secondary | ICD-10-CM | POA: Insufficient documentation

## 2015-05-22 DIAGNOSIS — X58XXXA Exposure to other specified factors, initial encounter: Secondary | ICD-10-CM | POA: Insufficient documentation

## 2015-05-22 DIAGNOSIS — Y9289 Other specified places as the place of occurrence of the external cause: Secondary | ICD-10-CM | POA: Insufficient documentation

## 2015-05-22 DIAGNOSIS — M25561 Pain in right knee: Secondary | ICD-10-CM

## 2015-05-22 DIAGNOSIS — S8991XA Unspecified injury of right lower leg, initial encounter: Secondary | ICD-10-CM | POA: Insufficient documentation

## 2015-05-22 DIAGNOSIS — Z720 Tobacco use: Secondary | ICD-10-CM | POA: Insufficient documentation

## 2015-05-22 DIAGNOSIS — Y9367 Activity, basketball: Secondary | ICD-10-CM | POA: Insufficient documentation

## 2015-05-22 MED ORDER — NAPROXEN 250 MG PO TABS: 250.0000 mg | ORAL_TABLET | Freq: Two times a day (BID) | ORAL | Status: DC

## 2015-05-22 NOTE — Discharge Instructions (Signed)
Knee Pain °The knee is the complex joint between your thigh and your lower leg. It is made up of bones, tendons, ligaments, and cartilage. The bones that make up the knee are: °· The femur in the thigh. °· The tibia and fibula in the lower leg. °· The patella or kneecap riding in the groove on the lower femur. °CAUSES  °Knee pain is a common complaint with many causes. A few of these causes are: °· Injury, such as: °¨ A ruptured ligament or tendon injury. °¨ Torn cartilage. °· Medical conditions, such as: °¨ Gout °¨ Arthritis °¨ Infections °· Overuse, over training, or overdoing a physical activity. °Knee pain can be minor or severe. Knee pain can accompany debilitating injury. Minor knee problems often respond well to self-care measures or get well on their own. More serious injuries may need medical intervention or even surgery. °SYMPTOMS °The knee is complex. Symptoms of knee problems can vary widely. Some of the problems are: °· Pain with movement and weight bearing. °· Swelling and tenderness. °· Buckling of the knee. °· Inability to straighten or extend your knee. °· Your knee locks and you cannot straighten it. °· Warmth and redness with pain and fever. °· Deformity or dislocation of the kneecap. °DIAGNOSIS  °Determining what is wrong may be very straight forward such as when there is an injury. It can also be challenging because of the complexity of the knee. Tests to make a diagnosis may include: °· Your caregiver taking a history and doing a physical exam. °· Routine X-rays can be used to rule out other problems. X-rays will not reveal a cartilage tear. Some injuries of the knee can be diagnosed by: °· Arthroscopy a surgical technique by which a small video camera is inserted through tiny incisions on the sides of the knee. This procedure is used to examine and repair internal knee joint problems. Tiny instruments can be used during arthroscopy to repair the torn knee cartilage (meniscus). °· Arthrography  is a radiology technique. A contrast liquid is directly injected into the knee joint. Internal structures of the knee joint then become visible on X-ray film. °· An MRI scan is a non X-ray radiology procedure in which magnetic fields and a computer produce two- or three-dimensional images of the inside of the knee. Cartilage tears are often visible using an MRI scanner. MRI scans have largely replaced arthrography in diagnosing cartilage tears of the knee. °· Blood work. °· Examination of the fluid that helps to lubricate the knee joint (synovial fluid). This is done by taking a sample out using a needle and a syringe. °TREATMENT °The treatment of knee problems depends on the cause. Some of these treatments are: °· Depending on the injury, proper casting, splinting, surgery, or physical therapy care will be needed. °· Give yourself adequate recovery time. Do not overuse your joints. If you begin to get sore during workout routines, back off. Slow down or do fewer repetitions. °· For repetitive activities such as cycling or running, maintain your strength and nutrition. °· Alternate muscle groups. For example, if you are a weight lifter, work the upper body on one day and the lower body the next. °· Either tight or weak muscles do not give the proper support for your knee. Tight or weak muscles do not absorb the stress placed on the knee joint. Keep the muscles surrounding the knee strong. °· Take care of mechanical problems. °· If you have flat feet, orthotics or special shoes may help.   See your caregiver if you need help. °· Arch supports, sometimes with wedges on the inner or outer aspect of the heel, can help. These can shift pressure away from the side of the knee most bothered by osteoarthritis. °· A brace called an "unloader" brace also may be used to help ease the pressure on the most arthritic side of the knee. °· If your caregiver has prescribed crutches, braces, wraps or ice, use as directed. The acronym  for this is PRICE. This means protection, rest, ice, compression, and elevation. °· Nonsteroidal anti-inflammatory drugs (NSAIDs), can help relieve pain. But if taken immediately after an injury, they may actually increase swelling. Take NSAIDs with food in your stomach. Stop them if you develop stomach problems. Do not take these if you have a history of ulcers, stomach pain, or bleeding from the bowel. Do not take without your caregiver's approval if you have problems with fluid retention, heart failure, or kidney problems. °· For ongoing knee problems, physical therapy may be helpful. °· Glucosamine and chondroitin are over-the-counter dietary supplements. Both may help relieve the pain of osteoarthritis in the knee. These medicines are different from the usual anti-inflammatory drugs. Glucosamine may decrease the rate of cartilage destruction. °· Injections of a corticosteroid drug into your knee joint may help reduce the symptoms of an arthritis flare-up. They may provide pain relief that lasts a few months. You may have to wait a few months between injections. The injections do have a small increased risk of infection, water retention, and elevated blood sugar levels. °· Hyaluronic acid injected into damaged joints may ease pain and provide lubrication. These injections may work by reducing inflammation. A series of shots may give relief for as long as 6 months. °· Topical painkillers. Applying certain ointments to your skin may help relieve the pain and stiffness of osteoarthritis. Ask your pharmacist for suggestions. Many over the-counter products are approved for temporary relief of arthritis pain. °· In some countries, doctors often prescribe topical NSAIDs for relief of chronic conditions such as arthritis and tendinitis. A review of treatment with NSAID creams found that they worked as well as oral medications but without the serious side effects. °PREVENTION °· Maintain a healthy weight. Extra pounds  put more strain on your joints. °· Get strong, stay limber. Weak muscles are a common cause of knee injuries. Stretching is important. Include flexibility exercises in your workouts. °· Be smart about exercise. If you have osteoarthritis, chronic knee pain or recurring injuries, you may need to change the way you exercise. This does not mean you have to stop being active. If your knees ache after jogging or playing basketball, consider switching to swimming, water aerobics, or other low-impact activities, at least for a few days a week. Sometimes limiting high-impact activities will provide relief. °· Make sure your shoes fit well. Choose footwear that is right for your sport. °· Protect your knees. Use the proper gear for knee-sensitive activities. Use kneepads when playing volleyball or laying carpet. Buckle your seat belt every time you drive. Most shattered kneecaps occur in car accidents. °· Rest when you are tired. °SEEK MEDICAL CARE IF:  °You have knee pain that is continual and does not seem to be getting better.  °SEEK IMMEDIATE MEDICAL CARE IF:  °Your knee joint feels hot to the touch and you have a high fever. °MAKE SURE YOU:  °· Understand these instructions. °· Will watch your condition. °· Will get help right away if you are not   doing well or get worse. °Document Released: 07/25/2007 Document Revised: 12/20/2011 Document Reviewed: 07/25/2007 °ExitCare® Patient Information ©2015 [employer]. This information is not intended to replace advice given to you by your health care provider. Make sure you discuss any questions you have with your health care provider. ° °Knee Bracing °Knee braces are supports to help stabilize and protect an injured or painful knee. They come in many different styles. They should support and protect the knee without increasing the chance of other injuries to yourself or others. It is important not to have a false sense of security when using a brace. Knee braces that help you  to keep using your knee: °· Do not restore normal knee stability under high stress forces. °· May decrease some aspects of athletic performance. °Some of the different types of knee braces are: °· Prophylactic knee braces are designed to prevent or reduce the severity of knee injuries during sports that make injury to the knee more likely. °· Rehabilitative knee braces are designed to allow protected motion of: °¨ Injured knees. °¨ Knees that have been treated with or without surgery. °There is no evidence that the use of a supportive knee brace protects the graft following a successful anterior cruciate ligament (ACL) reconstruction. However, braces are sometimes used to:  °· Protect injured ligaments. °· Control knee movement during the initial healing period. °They may be used as part of the treatment program for the various injured ligaments or cartilage of the knee including the: °· Anterior cruciate ligament. °· Medial collateral ligament. °· Medial or lateral cartilage (meniscus). °· Posterior cruciate ligament. °· Lateral collateral ligament. °Rehabilitative knee braces are most commonly used: °· During crutch-assisted walking right after injury. °· During crutch-assisted walking right after surgery to repair the cartilage and/or cruciate ligament injury. °· For a short period of time, 2-8 weeks, after the injury or surgery. °The value of a rehabilitative brace as opposed to a cast or splint includes the: °· Ability to adjust the brace for swelling. °· Ability to remove the brace for examinations, icing, or showering. °· Ability to allow for movement in a controlled range of motion. °Functional knee braces give support to knees that have already been injured. They are designed to provide stability for the injured knee and provide protection after repair. Functional knee braces may not affect performance much. Lower extremity muscle strengthening, flexibility, and improvement in technique are more important  than bracing in treating ligamentous knee injuries. Functional braces are not a substitute for rehabilitation or surgical procedures. °Unloader/off-loader braces are designed to provide pain relief in arthritic knees. Patients with wear and tear arthritis from growing old or from an old cartilage injury (osteoarthritis) of the knee, and bowlegged (varus) or knock-knee (valgus) deformities, often develop increased pain in the arthritic side due to increased loading. Unloader/off-loader braces are made to reduce uneven loading in such knees. There is reduction in bowing out movement in bowlegged knees when the correct unloader brace is used. Patients with advanced osteoarthritis or severe varus or valgus alignment problems would not likely benefit from bracing. °Patellofemoral braces help the kneecap to move smoothly and well centered over the end of the femur in the knee.  °Most people who wear knee braces feel that they help. However, there is a lack of scientific evidence that knee braces are helpful at the level needed for athletic participation to prevent injury. In spite of this, athletes report an increase in knee stability, pain relief, performance improvement, and confidence   during athletics when using a brace.  °Different knee problems require different knee braces: °· Your caregiver may suggest one kind of knee brace after knee surgery. °· A caregiver may choose another kind of knee brace for support instead of surgery for some types of torn ligaments. °· You may also need one for pain in the front of your knee that is not getting better with strengthening and flexibility exercises. °Get your caregiver's advice if you want to try a knee brace. The caregiver will advise you on where to get them and provide a prescription when it is needed to fashion and/or fit the brace. °Knee braces are the least important part of preventing knee injuries or getting better following injury. Stretching, strengthening and  technique improvement are far more important in caring for and preventing knee injuries. When strengthening your knee, increase your activities a little at a time so as not to develop injuries from overuse. Work out an exercise plan with your caregiver and/or physical therapist to get the best program for you. Do not let a knee brace become a crutch. °Always remember, there are no braces which support the knee as well as your original ligaments and cartilage you were born with. Conditioning, proper warm-up, and stretching remain the most important parts of keeping your knees healthy. °HOW TO USE A KNEE BRACE °· During sports, knee braces should be used as directed by your caregiver. °· Make sure that the hinges are where the knee bends. °· Straps, tapes, or hook-and-loop tapes should be fastened around your leg as instructed. °· You should check the placement of the brace during activities to make sure that it has not moved. Poorly positioned braces can hurt rather than help you. °· To work well, a knee brace should be worn during all activities that put you at risk of knee injury. °· Warm up properly before beginning athletic activities. °HOME CARE INSTRUCTIONS °· Knee braces often get damaged during normal use. Replace worn-out braces for maximum benefit. °· Clean regularly with soap and water. °· Inspect your brace often for wear and tear. °· Cover exposed metal to protect others from injury. °· Durable materials may cost more, but last longer. °SEEK IMMEDIATE MEDICAL CARE IF:  °· Your knee seems to be getting worse rather than better. °· You have increasing pain or swelling in the knee. °· You have problems caused by the knee brace. °· You have increased swelling or inflammation (redness or soreness) in your knee. °· Your knee becomes warm and more painful and you develop an unexplained temperature over 101°F (38.3°C). °MAKE SURE YOU:  °· Understand these instructions. °· Will watch your condition. °· Will get  help right away if you are not doing well or get worse. °See your caregiver, physical therapist, or orthopedic surgeon for additional information. °Document Released: 12/18/2003 Document Revised: 02/11/2014 Document Reviewed: 03/26/2009 °ExitCare® Patient Information ©2015 [employer]. This information is not intended to replace advice given to you by your health care provider. Make sure you discuss any questions you have with your health care provider. ° °

## 2015-05-22 NOTE — ED Notes (Signed)
Pt reports intermittent right knee swelling. Has injured/dislocated right knee before. Slight swelling noted. No hx gout. No other c/c. Ambulatory.

## 2015-05-22 NOTE — ED Provider Notes (Signed)
CSN: [id number]     Arrival date & time 05/22/15  1940 History  This chart was scribed for non-physician provider [name], PA-C, working with Elwin Mocha, MD by Phillis Haggis, ED Scribe. This patient was seen in room WTR7/WTR7 and patient care was started at 7:51 PM.    Chief Complaint  Patient presents with  . Knee Pain   The history is provided by the patient. No language interpreter was used.  HPI Comments: Adam Medina is a 24 y.o. male who presents to the Emergency Department complaining of right knee swelling and pain onset one week ago. Pt states that he was playing basketball when he twisted his knee. He states that he has continued to have intermittent pain and swelling to the anterior portion of the patella that he currently rates 5/10. Reports using a knee sleeve, ice and heat on the area to some relief, but states that the symptoms came back after weightbearing. States that the symptoms are worst when he is doing a long shift at work. He denies taking any medication for the pain. No hx of knee dislocation or patella dislocation. Reports hx of similar symptoms to the left knee. Denies fever, chill, numbness, or weakness.   History reviewed. No pertinent past medical history. History reviewed. No pertinent past surgical history. History reviewed. No pertinent family history. Social History  Substance Use Topics  . Smoking status: Current Every Day Smoker -- 0.50 packs/day    Types: Cigarettes  . Smokeless tobacco: Never Used  . Alcohol Use: Yes     Comment: socially    Review of Systems  Constitutional: Negative for fever and chills.  Musculoskeletal: Positive for joint swelling and arthralgias.  Skin: Negative for color change, rash and wound.  Neurological: Negative for weakness and numbness.   Allergies  Review of patient's allergies indicates no known allergies.  Home Medications   Prior to Admission medications   Medication Sig Start Date End Date Taking?  Authorizing Provider  acetaminophen (TYLENOL) 325 MG tablet Take 650 mg by mouth every 6 (six) hours as needed for mild pain.    Historical Provider, MD  diclofenac (VOLTAREN) 75 MG EC tablet Take 1 tablet (75 mg total) by mouth 2 (two) times daily. 02/03/14   Jennifer Piepenbrink, PA-C  ibuprofen (ADVIL,MOTRIN) 200 MG tablet Take 400 mg by mouth every 6 (six) hours as needed for moderate pain.    Historical Provider, MD  lidocaine (XYLOCAINE) 2 % solution Use as directed 20 mLs in the mouth or throat as needed for mouth pain. 02/03/14   Jennifer Piepenbrink, PA-C  naproxen (NAPROSYN) 250 MG tablet Take 1 tablet (250 mg total) by mouth 2 (two) times daily with a meal. 05/22/15   [name], PA-C  traMADol (ULTRAM) 50 MG tablet Take 1 tablet (50 mg total) by mouth every 6 (six) hours as needed. 10/08/14   Heather Laisure, PA-C   BP 153/55 mmHg  Pulse 91  Temp(Src) 98.4 F (36.9 C) (Oral)  Resp 16  SpO2 99%  Physical Exam  Constitutional: He is oriented to person, place, and time. He appears well-developed and well-nourished. No distress.  HENT:  Head: Normocephalic and atraumatic.  Eyes: Right eye exhibits no discharge. Left eye exhibits no discharge.  Cardiovascular: Normal rate, regular rhythm and intact distal pulses.   Bilateral posterior tibialis pulses intact.  Pulmonary/Chest: Effort normal. No respiratory distress.  Musculoskeletal: Normal range of motion. He exhibits edema and tenderness.  Moderate swelling to medial aspect of  right knee; no obvious right knee deformity, no right calf edema or tenderness; no right knee laxity noted. No erythema or warmth. Good an normal range of motion.  He is able to ambulate without difficulty or assistance.   Neurological: He is alert and oriented to person, place, and time. Coordination normal.  Sensation is intact to his bilateral lower extremities.  Skin: Skin is warm and dry. No rash noted. He is not diaphoretic. No erythema. No pallor.   Psychiatric: He has a normal mood and affect. His behavior is normal.  Nursing note and vitals reviewed.   ED Course  Procedures (including critical care time) DIAGNOSTIC STUDIES: Oxygen Saturation is 99% on RA, normal by my interpretation.    COORDINATION OF CARE: 7:53 PM-Discussed treatment plan which [facility], x-ray and Naprosyn with pt at bedside and pt agreed to plan. Pt declines x-ray due to not having insurance.   Labs Review Labs Reviewed - No data to display  Imaging Review No results found.    EKG Interpretation None      Filed Vitals:   05/22/15 1948  BP: 153/55  Pulse: 91  Temp: 98.4 F (36.9 C)  TempSrc: Oral  Resp: 16  SpO2: 99%     MDM   Meds given in ED:  Medications - No data to display  New Prescriptions   NAPROXEN (NAPROSYN) 250 MG TABLET    Take 1 tablet (250 mg total) by mouth 2 (two) times daily with a meal.    Final diagnoses:  Right knee pain   This is a 24 y.o. male who presents to the Emergency Department complaining of right knee swelling and pain onset one week ago. Pt states that he was playing basketball when he twisted his knee. He states that he has continued to have intermittent pain and swelling to the anterior portion of the patella that he currently rates 5/10. Reports using a knee sleeve, ice and heat on the area to some relief, but states that the symptoms came back after weightbearing. States that the symptoms are worst when he is doing a long shift at work.   On exam the patient is afebrile and nontoxic-appearing. The patient does have moderate edema to the medial aspect of his right knee. No erythema or warmth. He has good range of motion of his right knee. He is able to handle it without difficulty or assistance. No knee laxity noted. I offered the patient had x-ray of his knee and a knee sleeve however the patient declined both. He tells me he would like a medicine that would help with his pain  and swelling. Will provide him with a prescription for naproxen. I encouraged him to follow up with the [facility]. I advised the patient to follow-up with their primary care provider this week. I advised the patient to return to the emergency department with new or worsening symptoms or new concerns. The patient verbalized understanding and agreement with plan.    I personally performed the services described in this documentation, which was scribed in my presence. The recorded information has been reviewed and is accurate.      [name], PA-C 05/22/15 2007  Elwin Mocha, MD 05/23/15 1539

## 2016-10-21 ENCOUNTER — Emergency Department (HOSPITAL_COMMUNITY): Admission: EM | Admit: 2016-10-21 | Discharge: 2016-10-21 | Discharge status: Against Medical Advice | Payer: Self-pay

## 2016-10-22 ENCOUNTER — Encounter (HOSPITAL_COMMUNITY): Payer: Self-pay | Admitting: Emergency Medicine

## 2016-10-22 ENCOUNTER — Emergency Department (HOSPITAL_COMMUNITY)
Admission: EM | Admit: 2016-10-22 | Discharge: 2016-10-22 | Disposition: A | Discharge status: Home / Self Care | Payer: Self-pay | Attending: Dermatology | Admitting: Dermatology

## 2016-10-22 ENCOUNTER — Emergency Department (HOSPITAL_COMMUNITY)
Admission: EM | Admit: 2016-10-22 | Discharge: 2016-10-22 | Disposition: A | Discharge status: Home / Self Care | Payer: Self-pay | Attending: Emergency Medicine | Admitting: Emergency Medicine

## 2016-10-22 DIAGNOSIS — Z5321 Procedure and treatment not carried out due to patient leaving prior to being seen by health care provider: Secondary | ICD-10-CM | POA: Insufficient documentation

## 2016-10-22 DIAGNOSIS — Y999 Unspecified external cause status: Secondary | ICD-10-CM | POA: Insufficient documentation

## 2016-10-22 DIAGNOSIS — M545 Low back pain: Secondary | ICD-10-CM | POA: Insufficient documentation

## 2016-10-22 DIAGNOSIS — M546 Pain in thoracic spine: Secondary | ICD-10-CM | POA: Insufficient documentation

## 2016-10-22 DIAGNOSIS — Z79899 Other long term (current) drug therapy: Secondary | ICD-10-CM | POA: Insufficient documentation

## 2016-10-22 DIAGNOSIS — F17210 Nicotine dependence, cigarettes, uncomplicated: Secondary | ICD-10-CM | POA: Insufficient documentation

## 2016-10-22 DIAGNOSIS — Y9389 Activity, other specified: Secondary | ICD-10-CM | POA: Insufficient documentation

## 2016-10-22 DIAGNOSIS — X500XXA Overexertion from strenuous movement or load, initial encounter: Secondary | ICD-10-CM | POA: Insufficient documentation

## 2016-10-22 DIAGNOSIS — M549 Dorsalgia, unspecified: Secondary | ICD-10-CM

## 2016-10-22 DIAGNOSIS — Y929 Unspecified place or not applicable: Secondary | ICD-10-CM | POA: Insufficient documentation

## 2016-10-22 MED ORDER — KETOROLAC TROMETHAMINE 30 MG/ML IJ SOLN
30.0000 mg | Freq: Once | INTRAMUSCULAR | Status: AC
  Administered 2016-10-22: 30 mg via INTRAMUSCULAR
  Filled 2016-10-22: qty 1

## 2016-10-22 MED ORDER — DIAZEPAM 2 MG PO TABS: 2.0000 mg | ORAL_TABLET | Freq: Three times a day (TID) | ORAL | 0 refills | Status: DC

## 2016-10-22 MED ORDER — DICLOFENAC EPOLAMINE 1.3 % TD PTCH: 1.0000 | MEDICATED_PATCH | Freq: Two times a day (BID) | TRANSDERMAL | 0 refills | Status: AC

## 2016-10-22 NOTE — ED Provider Notes (Signed)
[facility]-EMERGENCY DEPT Provider Note   CSN: [id number] Arrival date & time: 10/22/16  0748     History   Chief Complaint Chief Complaint  Patient presents with  . Back Pain    HPI Adam Medina is a 26 y.o. male.  HPI  Patient presents with back pain. Pain began 3 days ago after he lifted a heavy object. Since that time there's been severe sore pain in the right mid thoracic area.  Pain is worse with any motion.  No other new complaints, including no new incontinence, abdominal pain, fever, chills, cough. No weakness or numbness, anywhere. Patient has had no relief with OTC medication including ibuprofen, Tylenol. Patient was well prior to the event, has no medical problems, denies other illnesses or complaints.   History reviewed. No pertinent past medical history.  There are no active problems to display for this patient.   History reviewed. No pertinent surgical history.     Home Medications    Prior to Admission medications   Medication Sig Start Date End Date Taking? Authorizing Provider  acetaminophen (TYLENOL) 325 MG tablet Take 650 mg by mouth every 6 (six) hours as needed for mild pain.    Historical Provider, MD  diazepam (VALIUM) 2 MG tablet Take 1 tablet (2 mg total) by mouth 3 (three) times daily. 10/22/16   Gerhard Munch, MD  diclofenac (FLECTOR) 1.3 % PTCH Place 1 patch onto the skin 2 (two) times daily. 10/22/16 10/27/16  Gerhard Munch, MD  diclofenac (VOLTAREN) 75 MG EC tablet Take 1 tablet (75 mg total) by mouth 2 (two) times daily. 02/03/14   Jennifer Piepenbrink, PA-C  ibuprofen (ADVIL,MOTRIN) 200 MG tablet Take 400 mg by mouth every 6 (six) hours as needed for moderate pain.    Historical Provider, MD  lidocaine (XYLOCAINE) 2 % solution Use as directed 20 mLs in the mouth or throat as needed for mouth pain. 02/03/14   Jennifer Piepenbrink, PA-C  naproxen (NAPROSYN) 250 MG tablet Take 1 tablet (250 mg total) by mouth 2 (two) times daily with a meal.  05/22/15   [name], PA-C  traMADol (ULTRAM) 50 MG tablet Take 1 tablet (50 mg total) by mouth every 6 (six) hours as needed. 10/08/14   Santiago Glad, PA-C    Family History No family history on file.  Social History Social History  Substance Use Topics  . Smoking status: Current Every Day Smoker    Packs/day: 0.50    Types: Cigarettes  . Smokeless tobacco: Never Used  . Alcohol use Yes     Comment: socially     Allergies   Patient has no known allergies.   Review of Systems Review of Systems  Constitutional:       Per HPI, otherwise negative  HENT:       Per HPI, otherwise negative  Respiratory:       Per HPI, otherwise negative  Cardiovascular:       Per HPI, otherwise negative  Gastrointestinal: Negative for vomiting.  Endocrine:       Negative aside from HPI  Genitourinary:       Neg aside from HPI   Musculoskeletal:       Per HPI, otherwise negative  Skin: Negative.   Neurological: Negative for syncope and weakness.     Physical Exam Updated Vital Signs BP (!) 128/114 (BP Location: Left Arm)   Pulse 84   Temp 98.1 F (36.7 C) (Oral)   Resp 18   SpO2 100%  Physical Exam  Constitutional: He is oriented to person, place, and time. He appears well-developed. No distress.  HENT:  Head: Normocephalic and atraumatic.  Eyes: Conjunctivae and EOM are normal.  Cardiovascular: Normal rate and regular rhythm.   Pulmonary/Chest: Effort normal. No stridor. No respiratory distress.  Abdominal: He exhibits no distension.  Musculoskeletal: He exhibits no edema.  Tenderness to palpation left  mid thoracic paraspinal area, no deformity.  Neurological: He is alert and oriented to person, place, and time.  Skin: Skin is warm and dry.  Psychiatric: He has a normal mood and affect.  Nursing note and vitals reviewed.    ED Treatments / Results   Procedures Procedures (including critical care time)  Medications Ordered in ED Medications  ketorolac  (TORADOL) 30 MG/ML injection 30 mg (not administered)     Initial Impression / Assessment and Plan / ED Course  I have reviewed the triage vital signs and the nursing notes.  Pertinent labs & imaging results that were available during my care of the patient were reviewed by me and considered in my medical decision making (see chart for details).  Clinical Course     Well-appearing male presents with new right paraspinal pain after lifting a heavy object. No evidence for neurovascular compromise.  Patient started on a course of anti-inflammatories, topical, muscle relaxant, will follow-up with primary care. Absent heart size, patient prepared for discharge with outpatient follow-up.   Final Clinical Impressions(s) / ED Diagnoses   Final diagnoses:  Mid back pain on right side    New Prescriptions New Prescriptions   DIAZEPAM (VALIUM) 2 MG TABLET    Take 1 tablet (2 mg total) by mouth 3 (three) times daily.   DICLOFENAC (FLECTOR) 1.3 % PTCH    Place 1 patch onto the skin 2 (two) times daily.     Gerhard Munchobert Celeste Candelas, MD 10/22/16 [phone]

## 2016-10-22 NOTE — ED Triage Notes (Signed)
Pt complaint of worsening right mid/lower back pain/spasms post heavy lifting on Tuesday. Pt denies GU symptoms.

## 2016-10-22 NOTE — ED Triage Notes (Signed)
Pt states he is having lower back pain that started on Tuesday after he was lifting something and thinks he turned wrong  Pt states his back feels knotted up   Pt states he has used heat, ice, icy hot, tylenol, advil, and aleve without relief

## 2016-10-22 NOTE — ED Notes (Signed)
Bed: WTR6 Expected date:  Expected time:  Means of arrival:  Comments: 

## 2019-10-08 ENCOUNTER — Other Ambulatory Visit: Payer: Self-pay

## 2019-10-08 DIAGNOSIS — Z79899 Other long term (current) drug therapy: Secondary | ICD-10-CM | POA: Insufficient documentation

## 2019-10-08 DIAGNOSIS — K029 Dental caries, unspecified: Secondary | ICD-10-CM | POA: Insufficient documentation

## 2019-10-08 DIAGNOSIS — F17210 Nicotine dependence, cigarettes, uncomplicated: Secondary | ICD-10-CM | POA: Insufficient documentation

## 2019-10-09 ENCOUNTER — Encounter (HOSPITAL_COMMUNITY): Payer: Self-pay | Admitting: Emergency Medicine

## 2019-10-09 ENCOUNTER — Emergency Department (HOSPITAL_COMMUNITY)
Admission: EM | Admit: 2019-10-09 | Discharge: 2019-10-09 | Disposition: A | Discharge status: Home / Self Care | Payer: Self-pay | Attending: Emergency Medicine | Admitting: Emergency Medicine

## 2019-10-09 DIAGNOSIS — K029 Dental caries, unspecified: Secondary | ICD-10-CM

## 2019-10-09 MED ORDER — IBUPROFEN 600 MG PO TABS: 600.0000 mg | ORAL_TABLET | Freq: Four times a day (QID) | ORAL | 0 refills | Status: DC | PRN

## 2019-10-09 MED ORDER — OXYCODONE-ACETAMINOPHEN 5-325 MG PO TABS
1.0000 | ORAL_TABLET | Freq: Once | ORAL | Status: AC
  Administered 2019-10-09: 01:00:00 1 via ORAL
  Filled 2019-10-09: qty 1

## 2019-10-09 MED ORDER — PENICILLIN V POTASSIUM 500 MG PO TABS: 500.0000 mg | ORAL_TABLET | Freq: Four times a day (QID) | ORAL | 0 refills | Status: AC

## 2019-10-09 MED ORDER — PENICILLIN V POTASSIUM 500 MG PO TABS
500.0000 mg | ORAL_TABLET | Freq: Once | ORAL | Status: AC
  Administered 2019-10-09: 01:00:00 500 mg via ORAL
  Filled 2019-10-09: qty 1

## 2019-10-09 NOTE — ED Notes (Signed)
Pt was verbalized discharge instructions. Pt had no further questions at this time. NAD. 

## 2019-10-09 NOTE — ED Triage Notes (Signed)
Patient here from home reporting left lower dental pain that started today.

## 2019-10-09 NOTE — Discharge Instructions (Signed)
Take the medications as prescribed. Recommend use of Dental Wax to cover the affected teeth when smoking.  Follow up with a dentist of your choice for further treatment of tooth decay.

## 2019-10-09 NOTE — ED Provider Notes (Signed)
[facility] DEPT Provider Note   CSN: [id number] Arrival date & time: 10/08/19  2356     History Chief Complaint  Patient presents with  . Dental Pain    Adam Medina is a 28 y.o. male.  Patient to ED with lower left dental pain since yesterday, worse today. No facial swelling, fever or difficulty swallowing. He reports history of fractured tooth but feels current dental pain is stemming from an adjacent tooth.   The history is provided by the patient. No language interpreter was used.  Dental Pain Associated symptoms: no facial swelling and no fever        History reviewed. No pertinent past medical history.  There are no problems to display for this patient.   History reviewed. No pertinent surgical history.     No family history on file.  Social History   Tobacco Use  . Smoking status: Current Every Day Smoker    Packs/day: 0.50    Types: Cigarettes  . Smokeless tobacco: Never Used  Substance Use Topics  . Alcohol use: Yes    Comment: socially  . Drug use: Yes    Frequency: 7.0 times per week    Types: Marijuana    Home Medications Prior to Admission medications   Medication Sig Start Date End Date Taking? Authorizing Provider  acetaminophen (TYLENOL) 325 MG tablet Take 650 mg by mouth every 6 (six) hours as needed for mild pain.    [provider]  diazepam (VALIUM) 2 MG tablet Take 1 tablet (2 mg total) by mouth 3 (three) times daily. 10/22/16   Carmin Muskrat, MD  diclofenac (VOLTAREN) 75 MG EC tablet Take 1 tablet (75 mg total) by mouth 2 (two) times daily. 02/03/14   Piepenbrink, Anderson Malta, PA-C  ibuprofen (ADVIL,MOTRIN) 200 MG tablet Take 400 mg by mouth every 6 (six) hours as needed for moderate pain.    [provider]  lidocaine (XYLOCAINE) 2 % solution Use as directed 20 mLs in the mouth or throat as needed for mouth pain. 02/03/14   Piepenbrink, Anderson Malta, PA-C  naproxen (NAPROSYN) 250 MG tablet  Take 1 tablet (250 mg total) by mouth 2 (two) times daily with a meal. 05/22/15   Waynetta Pean, PA-C  traMADol (ULTRAM) 50 MG tablet Take 1 tablet (50 mg total) by mouth every 6 (six) hours as needed. 10/08/14   Hyman Bible, PA-C    Allergies    Patient has no known allergies.  Review of Systems   Review of Systems  Constitutional: Negative for fever.  HENT: Positive for dental problem. Negative for facial swelling, sore throat and trouble swallowing.   Gastrointestinal: Negative for nausea.    Physical Exam Updated Vital Signs BP 139/88 (BP Location: Left Arm)   Pulse 87   Temp 98 F (36.7 C) (Oral)   Resp 16   Ht 6' (1.829 m)   Wt 54.4 kg   SpO2 97%   BMI 16.27 kg/m   Physical Exam Constitutional:      Appearance: He is well-developed.  HENT:     Mouth/Throat:     Comments: Widespread dental decay. Severe erosion of #'s 18 and 19. No visualized abscess. No facial swelling. Pulmonary:     Effort: Pulmonary effort is normal.  Musculoskeletal:        General: Normal range of motion.     Cervical back: Normal range of motion.  Skin:    General: Skin is warm and dry.  Neurological:  Mental Status: He is alert and oriented to person, place, and time.     ED Results / Procedures / Treatments   Labs (all labs ordered are listed, but only abnormal results are displayed) Labs Reviewed - No data to display  EKG None  Radiology No results found.  Procedures Procedures (including critical care time)  Medications Ordered in ED Medications - No data to display  ED Course  I have reviewed the triage vital signs and the nursing notes.  Pertinent labs & imaging results that were available during my care of the patient were reviewed by me and considered in my medical decision making (see chart for details).    MDM Rules/Calculators/A&P                      Patient to ED with dental pain x 2 days. No fever, or facial swelling.   Pain likely coming from  severe decay, potential apical infection given quick onset of pain. Will start on PCN, Rx ibuprofen. Will provide dental follow up resources.  Final Clinical Impression(s) / ED Diagnoses Final diagnoses:  None   1. Dental caries  Rx / DC Orders ED Discharge Orders    None       Elpidio Anis, PA-C 10/09/19 0048    Derwood Kaplan, MD 10/09/19 [phone]

## 2020-11-18 ENCOUNTER — Emergency Department (HOSPITAL_COMMUNITY)
Admission: EM | Admit: 2020-11-18 | Discharge: 2020-11-18 | Disposition: A | Discharge status: Home / Self Care | Payer: Self-pay | Attending: Emergency Medicine | Admitting: Emergency Medicine

## 2020-11-18 ENCOUNTER — Emergency Department (HOSPITAL_COMMUNITY): Payer: Self-pay

## 2020-11-18 ENCOUNTER — Other Ambulatory Visit: Payer: Self-pay

## 2020-11-18 ENCOUNTER — Encounter (HOSPITAL_COMMUNITY): Payer: Self-pay

## 2020-11-18 DIAGNOSIS — F17210 Nicotine dependence, cigarettes, uncomplicated: Secondary | ICD-10-CM | POA: Insufficient documentation

## 2020-11-18 DIAGNOSIS — K6289 Other specified diseases of anus and rectum: Secondary | ICD-10-CM | POA: Insufficient documentation

## 2020-11-18 LAB — URINALYSIS, ROUTINE W REFLEX MICROSCOPIC
Bilirubin Urine: NEGATIVE
Glucose, UA: NEGATIVE mg/dL
Hgb urine dipstick: NEGATIVE
Ketones, ur: NEGATIVE mg/dL
Leukocytes,Ua: NEGATIVE
Nitrite: NEGATIVE
Protein, ur: NEGATIVE mg/dL
Specific Gravity, Urine: >1.046 — ABNORMAL HIGH (ref 1.005–1.030)
pH: 6 (ref 5.0–8.0)

## 2020-11-18 LAB — CBC WITH DIFFERENTIAL/PLATELET
Abs Immature Granulocytes: 0.01 10*3/uL (ref 0.00–0.07)
Basophils Absolute: 0 10*3/uL (ref 0.0–0.1)
Basophils Relative: 1 %
Eosinophils Absolute: 0.1 10*3/uL (ref 0.0–0.5)
Eosinophils Relative: 2 %
HCT: 43.1 % (ref 39.0–52.0)
Hemoglobin: 14.3 g/dL (ref 13.0–17.0)
Immature Granulocytes: 0 %
Lymphocytes Relative: 59 %
Lymphs Abs: 3.3 10*3/uL (ref 0.7–4.0)
MCH: 32.8 pg (ref 26.0–34.0)
MCHC: 33.2 g/dL (ref 30.0–36.0)
MCV: 98.9 fL (ref 80.0–100.0)
Monocytes Absolute: 0.5 10*3/uL (ref 0.1–1.0)
Monocytes Relative: 9 %
Neutro Abs: 1.6 10*3/uL — ABNORMAL LOW (ref 1.7–7.7)
Neutrophils Relative %: 29 %
Platelets: 216 10*3/uL (ref 150–400)
RBC: 4.36 MIL/uL (ref 4.22–5.81)
RDW: 11.3 % — ABNORMAL LOW (ref 11.5–15.5)
WBC: 5.6 10*3/uL (ref 4.0–10.5)
nRBC: 0 % (ref 0.0–0.2)

## 2020-11-18 LAB — LACTIC ACID, PLASMA
Lactic Acid, Venous: 2 mmol/L (ref 0.5–1.9)
Lactic Acid, Venous: 1.1 mmol/L (ref 0.5–1.9)

## 2020-11-18 LAB — COMPREHENSIVE METABOLIC PANEL
ALT: 58 U/L — ABNORMAL HIGH (ref 0–44)
AST: 36 U/L (ref 15–41)
Albumin: 4.3 g/dL (ref 3.5–5.0)
Alkaline Phosphatase: 55 U/L (ref 38–126)
Anion gap: 11 (ref 5–15)
BUN: 15 mg/dL (ref 6–20)
CO2: 23 mmol/L (ref 22–32)
Calcium: 9.1 mg/dL (ref 8.9–10.3)
Chloride: 104 mmol/L (ref 98–111)
Creatinine, Ser: 0.95 mg/dL (ref 0.61–1.24)
GFR, Estimated: >60 mL/min (ref >60)
Glucose, Bld: 99 mg/dL (ref 70–99)
Potassium: 4.2 mmol/L (ref 3.5–5.1)
Sodium: 138 mmol/L (ref 135–145)
Total Bilirubin: 1.5 mg/dL — ABNORMAL HIGH (ref 0.3–1.2)
Total Protein: 7.2 g/dL (ref 6.5–8.1)

## 2020-11-18 MED ORDER — MORPHINE SULFATE (PF) 4 MG/ML IV SOLN
4.0000 mg | Freq: Once | INTRAVENOUS | Status: AC
  Administered 2020-11-18: 4 mg via INTRAVENOUS
  Filled 2020-11-18: qty 1

## 2020-11-18 MED ORDER — ONDANSETRON HCL 4 MG/2ML IJ SOLN
4.0000 mg | Freq: Once | INTRAMUSCULAR | Status: AC
  Administered 2020-11-18: 4 mg via INTRAVENOUS
  Filled 2020-11-18: qty 2

## 2020-11-18 MED ORDER — IOHEXOL 300 MG/ML  SOLN
100.0000 mL | Freq: Once | INTRAMUSCULAR | Status: AC | PRN
  Administered 2020-11-18: 100 mL via INTRAVENOUS

## 2020-11-18 MED ORDER — SODIUM CHLORIDE 0.9 % IV BOLUS
1000.0000 mL | Freq: Once | INTRAVENOUS | Status: AC
  Administered 2020-11-18: 1000 mL via INTRAVENOUS

## 2020-11-18 NOTE — Discharge Instructions (Addendum)
Your CT today does not show an abscess or infection. Your pain may be coming from an anal fissure- a small tear. This is treated with stool softeners, fiber, sitz baths. Recommend follow up with general surgery if not improving.

## 2020-11-18 NOTE — ED Notes (Signed)
Patient left without letting staff take out his IV Patient did not have DC papers

## 2020-11-18 NOTE — ED Triage Notes (Signed)
Patient reports rectal pain X2 days.  No obvious masses noted from external exam.   Patient denies bleeding.   Patient reports hx of abscesses all over body.    A/ox4 Ambulatory in triage.

## 2020-11-18 NOTE — ED Provider Notes (Signed)
[facility]-EMERGENCY DEPT Provider Note   CSN: [id number] Arrival date & time: 11/18/20  1241     History Chief Complaint  Patient presents with  . Rectal Pain    Adam Medina is a 30 y.o. male.  30 year old male with no significant past medical history presents with complaint of rectal pain. Reports onset of vague discomfort 5 days ago, states had multiple stools and thought that may have caused the discomfort. Reports pain x 2 days, worse today, unable to sit/cough, has not passed stool, reports rectal pain with voiding. Denies bloody or purulent stools or discharge, abdominal pain, fevers, nausea, vomiting or any other complaints or concerns.         History reviewed. No pertinent past medical history.  There are no problems to display for this patient.   History reviewed. No pertinent surgical history.     History reviewed. No pertinent family history.  Social History   Tobacco Use  . Smoking status: Current Every Day Smoker    Packs/day: 0.50    Types: Cigarettes  . Smokeless tobacco: Never Used  Substance Use Topics  . Alcohol use: Yes    Comment: socially  . Drug use: Yes    Frequency: 7.0 times per week    Types: Marijuana    Home Medications Prior to Admission medications   Medication Sig Start Date End Date Taking? Authorizing Provider  acetaminophen (TYLENOL) 325 MG tablet Take 650 mg by mouth every 6 (six) hours as needed for mild pain.   Yes [provider]  diazepam (VALIUM) 2 MG tablet Take 1 tablet (2 mg total) by mouth 3 (three) times daily. Patient not taking: No sig reported 10/22/16   Gerhard Munch, MD  diclofenac (VOLTAREN) 75 MG EC tablet Take 1 tablet (75 mg total) by mouth 2 (two) times daily. Patient not taking: No sig reported 02/03/14   Piepenbrink, Victorino Dike, PA-C  ibuprofen (ADVIL) 600 MG tablet Take 1 tablet (600 mg total) by mouth every 6 (six) hours as needed. Patient not taking: No sig reported  10/09/19   Elpidio Anis, PA-C  lidocaine (XYLOCAINE) 2 % solution Use as directed 20 mLs in the mouth or throat as needed for mouth pain. Patient not taking: No sig reported 02/03/14   Piepenbrink, Victorino Dike, PA-C  naproxen (NAPROSYN) 250 MG tablet Take 1 tablet (250 mg total) by mouth 2 (two) times daily with a meal. Patient not taking: No sig reported 05/22/15   [name], PA-C  traMADol (ULTRAM) 50 MG tablet Take 1 tablet (50 mg total) by mouth every 6 (six) hours as needed. Patient not taking: No sig reported 10/08/14   Santiago Glad, PA-C    Allergies    Patient has no known allergies.  Review of Systems   Review of Systems  Constitutional: Negative for chills and fever.  Gastrointestinal: Positive for rectal pain. Negative for abdominal pain, blood in stool, constipation, diarrhea, nausea and vomiting.  Genitourinary: Negative for difficulty urinating, dysuria, scrotal swelling, testicular pain and urgency.  Musculoskeletal: Negative for back pain.  Skin: Negative for rash and wound.  Allergic/Immunologic: Negative for immunocompromised state.  Neurological: Negative for weakness.  All other systems reviewed and are negative.   Physical Exam Updated Vital Signs BP 104/67   Pulse (!) 50   Temp 98.8 F (37.1 C) (Oral)   Resp 16   SpO2 99%   Physical Exam Vitals and nursing note reviewed. Exam conducted with a chaperone present.  Constitutional:  General: He is not in acute distress.    Appearance: He is well-developed and well-nourished. He is not diaphoretic.  HENT:     Head: Normocephalic and atraumatic.  Cardiovascular:     Rate and Rhythm: Normal rate and regular rhythm.     Heart sounds: Normal heart sounds.  Pulmonary:     Effort: Pulmonary effort is normal.     Breath sounds: Normal breath sounds.  Abdominal:     Palpations: Abdomen is soft.     Tenderness: There is no abdominal tenderness.  Genitourinary:      Comments: Significant  tenderness posteriorly, unable to tolerate rectal exam, no obvious external findings.  Skin:    General: Skin is warm and dry.     Findings: No erythema or rash.  Neurological:     Mental Status: He is alert and oriented to person, place, and time.  Psychiatric:        Mood and Affect: Mood and affect normal.        Behavior: Behavior normal.     ED Results / Procedures / Treatments   Labs (all labs ordered are listed, but only abnormal results are displayed) Labs Reviewed  LACTIC ACID, PLASMA - Abnormal; Notable for the following components:      Result Value   Lactic Acid, Venous 2.0 (*)    All other components within normal limits  COMPREHENSIVE METABOLIC PANEL - Abnormal; Notable for the following components:   ALT 58 (*)    Total Bilirubin 1.5 (*)    All other components within normal limits  CBC WITH DIFFERENTIAL/PLATELET - Abnormal; Notable for the following components:   RDW 11.3 (*)    Neutro Abs 1.6 (*)    All other components within normal limits  CULTURE, BLOOD (ROUTINE X 2)  CULTURE, BLOOD (ROUTINE X 2)  LACTIC ACID, PLASMA  URINALYSIS, ROUTINE W REFLEX MICROSCOPIC    EKG None  Radiology CT Abdomen Pelvis W Contrast  Result Date: 11/18/2020 CLINICAL DATA:  Rectal pain EXAM: CT ABDOMEN AND PELVIS WITH CONTRAST TECHNIQUE: Multidetector CT imaging of the abdomen and pelvis was performed using the standard protocol following bolus administration of intravenous contrast. CONTRAST:  OMNIPAQUE IOHEXOL 300 MG/ML  SOLN COMPARISON:  None. FINDINGS: LOWER CHEST: Normal. HEPATOBILIARY: Normal hepatic contours. No intra- or extrahepatic biliary dilatation. The gallbladder is normal. PANCREAS: Normal pancreas. No ductal dilatation or peripancreatic fluid collection. SPLEEN: Normal. ADRENALS/URINARY TRACT: The adrenal glands are normal. No hydronephrosis, nephroureterolithiasis or solid renal mass. The urinary bladder is normal for degree of distention STOMACH/BOWEL: There  is no hiatal hernia. Normal duodenal course and caliber. No small bowel dilatation or inflammation. No focal colonic abnormality. Normal appendix. VASCULAR/LYMPHATIC: Normal course and caliber of the major abdominal vessels. No abdominal or pelvic lymphadenopathy. REPRODUCTIVE: Normal prostate size with symmetric seminal vesicles. MUSCULOSKELETAL. No bony spinal canal stenosis or focal osseous abnormality. OTHER: No perirectal abscess. No inflammatory change in the perineum. IMPRESSION: Normal CT of the abdomen and pelvis. Electronically Signed   By: Deatra Robinson M.D.   On: 11/18/2020 19:05    Procedures Procedures   Medications Ordered in ED Medications  sodium chloride 0.9 % bolus 1,000 mL (0 mLs Intravenous Stopped 11/18/20 1911)  ondansetron (ZOFRAN) injection 4 mg (4 mg Intravenous Given 11/18/20 1744)  morphine 4 MG/ML injection 4 mg (4 mg Intravenous Given 11/18/20 1744)  iohexol (OMNIPAQUE) 300 MG/ML solution 100 mL (100 mLs Intravenous Contrast Given 11/18/20 1813)    ED Course  I  have reviewed the triage vital signs and the nursing notes.  Pertinent labs & imaging results that were available during my care of the patient were reviewed by me and considered in my medical decision making (see chart for details).  Clinical Course as of 11/18/20 2004  Tue Nov 18, 2020  1918 29yo male with complaint of rectal pain. Exam limited as patient does not tolerate rectal exam, reports pain posteriorly, midline, no obvious external changes. CBC and CMP without significant findings. Initial lactic acid elevated to 2.0, improved to 1.1 without intervention. Patient is afebrile.  CT negative for abscess or other acute findings to explain patient's pain tonight.  Urinalysis was added on, urine is at bedside and is clear, denies urinary symptoms.  Patient requesting to leave at this time, states he is at home to care for family.  Discussed possible fissure causing his significant pain without findings  otherwise.  Patient states that he has had a fissure before, feels like this is different.  Patient is advised to try sitz bath's, stool softeners and fiber supplement and follow-up with general surgery for further evaluation if pain persist. [LM]    Clinical Course User Index [LM] Alden Hipp   MDM Rules/Calculators/A&P                          Final Clinical Impression(s) / ED Diagnoses Final diagnoses:  Rectal pain    Rx / DC Orders ED Discharge Orders    None       Alden Hipp 11/18/20 2004    Derwood Kaplan, MD 11/18/20 2244

## 2020-11-19 LAB — CULTURE, BLOOD (ROUTINE X 2)

## 2020-11-21 LAB — CULTURE, BLOOD (ROUTINE X 2): Special Requests: ADEQUATE

## 2020-11-23 LAB — CULTURE, BLOOD (ROUTINE X 2)
Culture: NO GROWTH
Culture: NO GROWTH
Special Requests: ADEQUATE

## 2021-07-30 ENCOUNTER — Emergency Department (HOSPITAL_COMMUNITY)
Admission: EM | Admit: 2021-07-30 | Discharge: 2021-07-30 | Disposition: A | Discharge status: Home / Self Care | Payer: Self-pay | Attending: Emergency Medicine | Admitting: Emergency Medicine

## 2021-07-30 ENCOUNTER — Encounter (HOSPITAL_COMMUNITY): Payer: Self-pay

## 2021-07-30 ENCOUNTER — Other Ambulatory Visit: Payer: Self-pay

## 2021-07-30 DIAGNOSIS — F17210 Nicotine dependence, cigarettes, uncomplicated: Secondary | ICD-10-CM | POA: Insufficient documentation

## 2021-07-30 DIAGNOSIS — Z79899 Other long term (current) drug therapy: Secondary | ICD-10-CM | POA: Insufficient documentation

## 2021-07-30 DIAGNOSIS — R221 Localized swelling, mass and lump, neck: Secondary | ICD-10-CM | POA: Insufficient documentation

## 2021-07-30 DIAGNOSIS — J020 Streptococcal pharyngitis: Secondary | ICD-10-CM | POA: Insufficient documentation

## 2021-07-30 DIAGNOSIS — Z20822 Contact with and (suspected) exposure to covid-19: Secondary | ICD-10-CM | POA: Insufficient documentation

## 2021-07-30 LAB — RESP PANEL BY RT-PCR (FLU A&B, COVID) ARPGX2
Influenza A by PCR: NEGATIVE
Influenza B by PCR: NEGATIVE
SARS Coronavirus 2 by RT PCR: NEGATIVE

## 2021-07-30 LAB — GROUP A STREP BY PCR: Group A Strep by PCR: DETECTED — AB

## 2021-07-30 MED ORDER — LIDOCAINE VISCOUS HCL 2 % MT SOLN: 15.0000 mL | OROMUCOSAL | 0 refills | Status: AC | PRN

## 2021-07-30 MED ORDER — LIDOCAINE VISCOUS HCL 2 % MT SOLN
15.0000 mL | Freq: Once | OROMUCOSAL | Status: AC
  Administered 2021-07-30: 15 mL via OROMUCOSAL
  Filled 2021-07-30: qty 15

## 2021-07-30 MED ORDER — PENICILLIN G BENZATHINE 1200000 UNIT/2ML IM SUSY
1.2000 10*6.[IU] | PREFILLED_SYRINGE | Freq: Once | INTRAMUSCULAR | Status: AC
  Administered 2021-07-30: 1.2 10*6.[IU] via INTRAMUSCULAR
  Filled 2021-07-30: qty 2

## 2021-07-30 MED ORDER — ACETAMINOPHEN 500 MG PO TABS: 1000.0000 mg | ORAL_TABLET | Freq: Four times a day (QID) | ORAL | 0 refills | Status: AC | PRN

## 2021-07-30 NOTE — ED Provider Notes (Signed)
[facility]-EMERGENCY DEPT Provider Note   CSN: [id number] Arrival date & time: 07/30/21  0753     History Chief Complaint  Patient presents with   Sore Throat    Adam Medina is a 30 y.o. male.  The history is provided by the patient. No language interpreter was used.  Sore Throat   30 year old male who presents for evaluation of sore throat.  Patient report for the past 3 days he has had progressive worsening sore throat.  States it is irritating to talk swallowing, symptoms moderate in severity.  He noticed more swelling nodules around his neck that is painful to the touch.  He denies fever, productive cough, congestion, chest pain or trouble breathing or abdominal pain.  No specific treatment tried at home.  He has been vaccinated for COVID.  He denies any recent sick contact.  History reviewed. No pertinent past medical history.  There are no problems to display for this patient.   History reviewed. No pertinent surgical history.     History reviewed. No pertinent family history.  Social History   Tobacco Use   Smoking status: Every Day    Packs/day: 0.50    Types: Cigarettes   Smokeless tobacco: Never  Substance Use Topics   Alcohol use: Yes    Comment: socially   Drug use: Yes    Frequency: 7.0 times per week    Types: Marijuana    Home Medications Prior to Admission medications   Medication Sig Start Date End Date Taking? Authorizing Provider  acetaminophen (TYLENOL) 325 MG tablet Take 650 mg by mouth every 6 (six) hours as needed for mild pain.    [provider]  diazepam (VALIUM) 2 MG tablet Take 1 tablet (2 mg total) by mouth 3 (three) times daily. Patient not taking: No sig reported 10/22/16   Gerhard Munch, MD  diclofenac (VOLTAREN) 75 MG EC tablet Take 1 tablet (75 mg total) by mouth 2 (two) times daily. Patient not taking: No sig reported 02/03/14   Piepenbrink, Victorino Dike, PA-C  ibuprofen (ADVIL) 600 MG tablet  Take 1 tablet (600 mg total) by mouth every 6 (six) hours as needed. Patient not taking: No sig reported 10/09/19   Elpidio Anis, PA-C  lidocaine (XYLOCAINE) 2 % solution Use as directed 20 mLs in the mouth or throat as needed for mouth pain. Patient not taking: No sig reported 02/03/14   Piepenbrink, Victorino Dike, PA-C  naproxen (NAPROSYN) 250 MG tablet Take 1 tablet (250 mg total) by mouth 2 (two) times daily with a meal. Patient not taking: No sig reported 05/22/15   [name], PA-C  traMADol (ULTRAM) 50 MG tablet Take 1 tablet (50 mg total) by mouth every 6 (six) hours as needed. Patient not taking: No sig reported 10/08/14   Santiago Glad, PA-C    Allergies    Patient has no known allergies.  Review of Systems   Review of Systems  All other systems reviewed and are negative.  Physical Exam Updated Vital Signs BP 134/65 (BP Location: Left Arm)   Pulse 90   Temp 98.1 F (36.7 C) (Oral)   Resp 18   SpO2 97%   Physical Exam Vitals and nursing note reviewed.  Constitutional:      General: He is not in acute distress.    Appearance: He is well-developed.  HENT:     Head: Atraumatic.     Right Ear: Tympanic membrane normal.     Left Ear: Tympanic membrane normal.  Nose: No congestion or rhinorrhea.     Mouth/Throat:     Mouth: Mucous membranes are moist. No oral lesions.     Pharynx: Uvula midline. Posterior oropharyngeal erythema (Right posterior oropharyngeal erythema noted) present. No oropharyngeal exudate or uvula swelling.     Tonsils: No tonsillar exudate or tonsillar abscesses.  Eyes:     Conjunctiva/sclera: Conjunctivae normal.  Cardiovascular:     Rate and Rhythm: Normal rate and regular rhythm.     Heart sounds: Normal heart sounds.  Pulmonary:     Effort: Pulmonary effort is normal.     Breath sounds: Normal breath sounds. No wheezing, rhonchi or rales.  Abdominal:     Palpations: Abdomen is soft.     Tenderness: There is no abdominal tenderness.   Musculoskeletal:     Cervical back: Neck supple.  Lymphadenopathy:     Cervical: Cervical adenopathy present.  Skin:    Findings: No rash.  Neurological:     Mental Status: He is alert.  Psychiatric:        Mood and Affect: Mood normal.    ED Results / Procedures / Treatments   Labs (all labs ordered are listed, but only abnormal results are displayed) Labs Reviewed  GROUP A STREP BY PCR - Abnormal; Notable for the following components:      Result Value   Group A Strep by PCR DETECTED (*)    All other components within normal limits  RESP PANEL BY RT-PCR (FLU A&B, COVID) ARPGX2    EKG None  Radiology No results found.  Procedures Procedures   Medications Ordered in ED Medications - No data to display  ED Course  I have reviewed the triage vital signs and the nursing notes.  Pertinent labs & imaging results that were available during my care of the patient were reviewed by me and considered in my medical decision making (see chart for details).    MDM Rules/Calculators/A&P                           BP 123/80 (BP Location: Right Arm)   Pulse 67   Temp 98.2 F (36.8 C) (Oral)   Resp 16   SpO2 97%   Final Clinical Impression(s) / ED Diagnoses Final diagnoses:  Strep pharyngitis    Rx / DC Orders ED Discharge Orders     None      8:20 AM Patient with sore throat for the past 3 days.  He also has evidence of reactive anterior cervical chain lymph nodes.  He does not have any finding concerning for deep tissue infection.  He is tolerating his secretions.  We will obtain strep test as well as a viral respiratory panel   Fayrene Helper, PA-C 07/31/21 2158    Tegeler, Canary Brim, MD 08/01/21 1102

## 2021-07-30 NOTE — ED Triage Notes (Signed)
Pt presents with c/o sore throat for 3 days with difficulty swallowing.

## 2021-07-30 NOTE — Discharge Instructions (Signed)
You have been diagnosed with having strep throat.  You have received antibiotic treatment in the ER.  Use viscous lidocaine as needed for throat irritation.  Take tylenol for fever and or pain.  Return if you have any concerns.

## 2022-11-09 ENCOUNTER — Emergency Department (HOSPITAL_COMMUNITY)
Admission: EM | Admit: 2022-11-09 | Discharge: 2022-11-09 | Disposition: A | Discharge status: Home / Self Care | Payer: Medicaid Other | Attending: Emergency Medicine | Admitting: Emergency Medicine

## 2022-11-09 ENCOUNTER — Encounter (HOSPITAL_COMMUNITY): Payer: Self-pay

## 2022-11-09 DIAGNOSIS — Z20822 Contact with and (suspected) exposure to covid-19: Secondary | ICD-10-CM | POA: Diagnosis not present

## 2022-11-09 DIAGNOSIS — J029 Acute pharyngitis, unspecified: Secondary | ICD-10-CM | POA: Insufficient documentation

## 2022-11-09 DIAGNOSIS — J069 Acute upper respiratory infection, unspecified: Secondary | ICD-10-CM | POA: Diagnosis not present

## 2022-11-09 DIAGNOSIS — B9789 Other viral agents as the cause of diseases classified elsewhere: Secondary | ICD-10-CM | POA: Insufficient documentation

## 2022-11-09 DIAGNOSIS — R059 Cough, unspecified: Secondary | ICD-10-CM | POA: Diagnosis present

## 2022-11-09 LAB — RESP PANEL BY RT-PCR (RSV, FLU A&B, COVID)  RVPGX2
Influenza A by PCR: NEGATIVE
Influenza B by PCR: NEGATIVE
Resp Syncytial Virus by PCR: NEGATIVE
SARS Coronavirus 2 by RT PCR: NEGATIVE

## 2022-11-09 LAB — GROUP A STREP BY PCR: Group A Strep by PCR: NOT DETECTED

## 2022-11-09 MED ORDER — BENZONATATE 200 MG PO CAPS
200.0000 mg | ORAL_CAPSULE | Freq: Three times a day (TID) | ORAL | 0 refills | Status: AC
Start: 2022-11-09 — End: 2022-11-19

## 2022-11-09 NOTE — ED Triage Notes (Signed)
Pt c/o cough, congestion, runny nose, sore throat for about a week.

## 2022-11-09 NOTE — ED Provider Notes (Signed)
[facility] Provider Note   CSN: [id number] Arrival date & time: 11/09/22  0556     History  Chief Complaint  Patient presents with   Cough    Adam Medina is a 32 y.o. male.  32 year old male with complaint of URI symptoms for the past week including cough mostly at night, congestion and sore throat.  Denies body aches or fatigue.  No known sick contacts.  Has been managing at home with NyQuil and Mucinex without significant improvement.       Home Medications Prior to Admission medications   Medication Sig Start Date End Date Taking? Authorizing Provider  benzonatate (TESSALON) 200 MG capsule Take 1 capsule (200 mg total) by mouth every 8 (eight) hours for 10 days. 11/09/22 11/19/22 Yes Tacy Learn, PA-C  acetaminophen (TYLENOL) 500 MG tablet Take 2 tablets (1,000 mg total) by mouth every 6 (six) hours as needed for moderate pain. 07/30/21   Domenic Moras, PA-C  lidocaine (XYLOCAINE) 2 % solution Use as directed 15 mLs in the mouth or throat as needed for mouth pain. 07/30/21   Domenic Moras, PA-C      Allergies    Patient has no known allergies.    Review of Systems   Review of Systems Negative except as per HPI Physical Exam Updated Vital Signs BP (!) 140/92   Pulse 83   Temp 98 F (36.7 C) (Oral)   Resp 16   Ht 6' (1.829 m)   Wt 65.8 kg   SpO2 99%   BMI 19.67 kg/m  Physical Exam Vitals and nursing note reviewed.  Constitutional:      General: He is not in acute distress.    Appearance: He is well-developed. He is not diaphoretic.  HENT:     Head: Normocephalic and atraumatic.     Right Ear: Tympanic membrane and ear canal normal.     Left Ear: Tympanic membrane and ear canal normal.     Nose: Congestion present.     Mouth/Throat:     Mouth: Mucous membranes are moist.     Pharynx: No oropharyngeal exudate or posterior oropharyngeal erythema.  Eyes:     Conjunctiva/sclera: Conjunctivae normal.   Cardiovascular:     Rate and Rhythm: Normal rate and regular rhythm.     Heart sounds: Normal heart sounds.  Pulmonary:     Effort: Pulmonary effort is normal.     Breath sounds: Normal breath sounds.  Musculoskeletal:     Cervical back: Neck supple. No tenderness.  Skin:    General: Skin is warm and dry.     Findings: No erythema or rash.  Neurological:     Mental Status: He is alert and oriented to person, place, and time.  Psychiatric:        Behavior: Behavior normal.     ED Results / Procedures / Treatments   Labs (all labs ordered are listed, but only abnormal results are displayed) Labs Reviewed  RESP PANEL BY RT-PCR (RSV, FLU A&B, COVID)  [id number]  GROUP A STREP BY PCR    EKG None  Radiology No results found.  Procedures Procedures    Medications Ordered in ED Medications - No data to display  ED Course/ Medical Decision Making/ A&P                             Medical Decision Making  32 year old male presents with  complaint of 1 week of congestion, sore throat, cough and hoarse voice.  Not improving with OTC medications.  On exam does have nasal congestion, voice is hoarse.  Nose tender cervical adenopathy, no significant oropharyngeal findings.  Respiratory panel negative.  Due to concern for sore throat with negative respiratory panel, add on strep test.  Contacted patient with results which are negative.  Recommend home management, provided with Tessalon prescription, recommend recheck with urgent care for new symptoms or symptoms not improving in the next few days.        Final Clinical Impression(s) / ED Diagnoses Final diagnoses:  Viral URI with cough    Rx / DC Orders ED Discharge Orders          Ordered    benzonatate (TESSALON) 200 MG capsule  Every 8 hours        11/09/22 0850              Tacy Learn, PA-C 11/09/22 1018    Lacretia Leigh, MD 11/09/22 1440

## 2022-11-09 NOTE — Discharge Instructions (Addendum)
Tessalon as needed as prescribed for cough.  Your strep test results will be available in your MyChart later today.  If your strep test is positive, a prescription for antibiotics will be sent to your pharmacy.  Recheck with your PCP if not improving.

## 2024-07-31 ENCOUNTER — Ambulatory Visit (INDEPENDENT_AMBULATORY_CARE_PROVIDER_SITE_OTHER): Payer: Self-pay | Admitting: Podiatry

## 2024-07-31 ENCOUNTER — Encounter: Payer: Self-pay | Admitting: Podiatry

## 2024-07-31 DIAGNOSIS — M21611 Bunion of right foot: Secondary | ICD-10-CM

## 2024-07-31 DIAGNOSIS — M21612 Bunion of left foot: Secondary | ICD-10-CM

## 2024-07-31 DIAGNOSIS — L84 Corns and callosities: Secondary | ICD-10-CM

## 2024-07-31 NOTE — Progress Notes (Unsigned)
 Chief Complaint  Patient presents with   Callouses    Bilateral foot calluses. Non diabetic. 6 pain with pressure. Tried foot soaks, callus remover, debridement. [facility]etc....   HPI: 33 y.o. male presents today with multiple corns and calluses.  The patient took a phone call as soon as I stepped into the room but appeared to end the call within a minute.  He did not inform me that there was someone else still listening to our conversation that was not in the exam room, but was on his speaker phone.  He was asked at one point to end his phone call so that we could proceed with the exam and discussion, which he stated he did.  However, within 2 minutes, the woman on the phone began speaking on speaker phone again.  This needs to be noted because of HIPAA concerns and that consent was not given by myself for a nonpresent person to be listening/recording/involved in the visit.  Patient was made aware by the medical assistant that since he is self-pay, the amount of calluses to be shaved will affect the cost of the visit.  She reviewed this with the patient and did have him sign and acknowledgment form.  History reviewed. No pertinent past medical history. History reviewed. No pertinent surgical history. No Known Allergies   Physical Exam: Palpable pedal pulses.  No open lesions are noted.  There are thick, focal areas of hyperkeratosis on the medial aspect of the right second toe DIPJ, the right hallux plantar medial IPJ, right submet 2, right submet 5, distal left hallux, plantar medial left hallux IPJ, left submet 1 and left submet 3.  There is pain on palpation of all of the lesions.  There is no surrounding erythema to any of the lesions.  No ulceration is noted.  There is a bony prominence on the medial aspect of the first metatarsal head bilateral with lateral angulation of the hallux consistent with hallux valgus with bunion deformity.  Epicritic sensation is intact  Assessment/Plan of  Care: 1. Callus of foot   2. Bunion, left foot   3. Bunion, right foot     Discussed findings with patient today.  He would benefit from custom molded orthotics with accommodations made in the areas of discomfort.  Recommended he purchase over-the-counter inserts for now to see how well these may improve the calluses, but ultimately he would need a custom molded orthotic.  He can schedule with our pedorthist as needed for evaluation of custom-molded orthotics with accommodation at the callused areas.  The hyperkeratotic lesions x8 (locations noted above) were shaved with sterile #313 blade.  He did not tolerate this well due to discomfort/sensitivity.  Recommend over-the-counter urea 40 cream +2% salicylic acid to be applied to the calluses every night.  I am not sure I'm willing to see this patient again for care since he would not discontinue the phone call as requested and he was not truthful with me regarding someone else participating in the visit without my knowledge.  This breaks down the doctor-patient relationship and level of trust.  Follow-up with one of our other providers as needed  Awanda CHARM Imperial, DPM, FACFAS [facility]     [address]                                        [place],  [place] [address]                Office [phone]  Fax [phone]
